# Patient Record
Sex: Female | Born: 1995 | Race: White | Hispanic: No | Marital: Married | Smoking: Never smoker
Health system: Southern US, Community
[De-identification: ages and names within clinical notes are randomized; demographics above are authoritative.]

## PROBLEM LIST (undated history)

## (undated) ENCOUNTER — Inpatient Hospital Stay (HOSPITAL_COMMUNITY): Payer: Self-pay

## (undated) DIAGNOSIS — F32A Depression, unspecified: Secondary | ICD-10-CM

## (undated) DIAGNOSIS — F909 Attention-deficit hyperactivity disorder, unspecified type: Secondary | ICD-10-CM

## (undated) DIAGNOSIS — O139 Gestational [pregnancy-induced] hypertension without significant proteinuria, unspecified trimester: Secondary | ICD-10-CM

## (undated) DIAGNOSIS — F329 Major depressive disorder, single episode, unspecified: Secondary | ICD-10-CM

## (undated) HISTORY — PX: NO PAST SURGERIES: SHX2092

## (undated) HISTORY — DX: Attention-deficit hyperactivity disorder, unspecified type: F90.9

## (undated) HISTORY — PX: BREAST SURGERY: SHX581

---
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
## 2016-06-04 ENCOUNTER — Encounter: Payer: Self-pay | Admitting: Family Medicine

## 2016-06-04 ENCOUNTER — Ambulatory Visit (INDEPENDENT_AMBULATORY_CARE_PROVIDER_SITE_OTHER): Payer: Managed Care, Other (non HMO) | Admitting: Family Medicine

## 2016-06-04 VITALS — BP 120/80 | HR 68 | Ht 65.5 in | Wt 151.4 lb

## 2016-06-04 DIAGNOSIS — F988 Other specified behavioral and emotional disorders with onset usually occurring in childhood and adolescence: Secondary | ICD-10-CM

## 2016-06-04 DIAGNOSIS — F909 Attention-deficit hyperactivity disorder, unspecified type: Secondary | ICD-10-CM | POA: Diagnosis not present

## 2016-06-04 MED ORDER — AMPHETAMINE-DEXTROAMPHET ER 20 MG PO CP24: 20.0000 mg | ORAL_CAPSULE | ORAL | 0 refills | Status: DC

## 2016-06-04 NOTE — Progress Notes (Signed)
   Subjective:    Patient ID: Stacy Myers, female    DOB: 10-03-1996, 20 y.o.   MRN: [id number]  HPI Chief Complaint  Patient presents with  . Advice Only    discuss going back on adderall   She is new to the practice and here to discuss ADHD.  She has been going to Dr. Merla Riches at [facility]. Has not had medical care other than for pregnancy in 4 years.  States she was diagnosed 6 years ago with ADD by Dr. Merla Riches. States she filled out a questionarire at his office and was found to meet criteria for ADD She states she is having a hard time focusing in a conversation and this is affecting her daily life. She remembers having issues since age 65.  Took adderall XR 20 mg for 2-3 years. States this worked well for her. Did not have any side effects. Has not taken it for 3 1/2 years due to pregnancy and then she never started back on it and tried to manage without it.   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 .   Denies history of depression, anxiety, addiction.  OB/GYN Dr. Rana Snare at [facility].   1 pregnancy. Age 40.  Has IUD. Is not breastfeeding.   Sore on inner lip that has been present for about a week. Dentist is Dr. Regan Rakers. She has not seen him for this issue.    Review of Systems Pertinent positives and negatives in the history of present illness.     Objective:   Physical Exam BP 120/80   Pulse 68   Ht 5' 5.5" (1.664 m)   Wt 151 lb 6.4 oz (68.7 kg)   BMI 24.81 kg/m   Alert and in no distress.Pharyngeal area is normal. Neck is supple without adenopathy or thyromegaly. Cardiac exam shows a regular sinus rhythm without murmurs or gallops. Lungs are clear to auscultation.      Assessment & Plan:  ADD (attention deficit disorder)  Reviewed medical record and Dr. Netta Corrigan note regarding patient ADD and medication history. Discussed that I will start her back on medication and see how she does. Prescription given  for Adderall XR 20 mg. She will call in one week and report back how the medication is helping, how long and if she has any side effects. She will follow up in 1 month before she needs a refill.  She is overdue for a physical and I recommend that she schedule this in the near future.

## 2016-06-04 NOTE — Patient Instructions (Signed)
I'm giving you prescription for Adderall today. Please call in one week and let me know-- is the medication working, how long is it working, and are you having any side effects??? Follow up in one month in order for me to refill the prescription and we can do your physical exam and labs at that time.

## 2016-06-10 ENCOUNTER — Telehealth: Payer: Self-pay | Admitting: Family Medicine

## 2016-06-10 NOTE — Telephone Encounter (Signed)
Rcvd office notes, pap from [facility]

## 2016-06-11 ENCOUNTER — Encounter: Payer: Self-pay | Admitting: Family Medicine

## 2016-06-11 DIAGNOSIS — R87610 Atypical squamous cells of undetermined significance on cytologic smear of cervix (ASC-US): Secondary | ICD-10-CM | POA: Insufficient documentation

## 2016-06-15 ENCOUNTER — Encounter: Payer: Self-pay | Admitting: Family Medicine

## 2016-07-03 ENCOUNTER — Ambulatory Visit (INDEPENDENT_AMBULATORY_CARE_PROVIDER_SITE_OTHER): Payer: Managed Care, Other (non HMO) | Admitting: Family Medicine

## 2016-07-03 ENCOUNTER — Encounter: Payer: Self-pay | Admitting: Family Medicine

## 2016-07-03 VITALS — BP 106/68 | HR 64 | Wt 148.2 lb

## 2016-07-03 DIAGNOSIS — Z862 Personal history of diseases of the blood and blood-forming organs and certain disorders involving the immune mechanism: Secondary | ICD-10-CM | POA: Diagnosis not present

## 2016-07-03 DIAGNOSIS — F988 Other specified behavioral and emotional disorders with onset usually occurring in childhood and adolescence: Secondary | ICD-10-CM

## 2016-07-03 DIAGNOSIS — F909 Attention-deficit hyperactivity disorder, unspecified type: Secondary | ICD-10-CM

## 2016-07-03 DIAGNOSIS — Z8639 Personal history of other endocrine, nutritional and metabolic disease: Secondary | ICD-10-CM | POA: Diagnosis not present

## 2016-07-03 LAB — COMPREHENSIVE METABOLIC PANEL
ALK PHOS: 55 U/L (ref 47–176)
ALT: 7 U/L (ref 5–32)
AST: 13 U/L (ref 12–32)
Albumin: 4.2 g/dL (ref 3.6–5.1)
BILIRUBIN TOTAL: 0.5 mg/dL (ref 0.2–1.1)
BUN: 9 mg/dL (ref 7–20)
CO2: 27 mmol/L (ref 20–31)
CREATININE: 0.61 mg/dL (ref 0.50–1.00)
Calcium: 9 mg/dL (ref 8.9–10.4)
Chloride: 103 mmol/L (ref 98–110)
Glucose, Bld: 78 mg/dL (ref 65–99)
Potassium: 4.7 mmol/L (ref 3.8–5.1)
SODIUM: 135 mmol/L (ref 135–146)
Total Protein: 6.6 g/dL (ref 6.3–8.2)

## 2016-07-03 LAB — CBC WITH DIFFERENTIAL/PLATELET
Basophils Absolute: 0 cells/uL (ref 0–200)
Basophils Relative: 0 %
EOS PCT: 2 %
Eosinophils Absolute: 90 cells/uL (ref 15–500)
HEMATOCRIT: 38.7 % (ref 35.0–45.0)
HEMOGLOBIN: 12.8 g/dL (ref 11.7–15.5)
Lymphocytes Relative: 37 %
Lymphs Abs: 1665 cells/uL (ref 850–3900)
MCH: 29.4 pg (ref 27.0–33.0)
MCHC: 33.1 g/dL (ref 32.0–36.0)
MCV: 89 fL (ref 80.0–100.0)
MONO ABS: 270 {cells}/uL (ref 200–950)
MPV: 9.7 fL (ref 7.5–12.5)
Monocytes Relative: 6 %
NEUTROS ABS: 2475 {cells}/uL (ref 1500–7800)
Neutrophils Relative %: 55 %
Platelets: 234 10*3/uL (ref 140–400)
RBC: 4.35 MIL/uL (ref 3.80–5.10)
RDW: 13.3 % (ref 11.0–15.0)
WBC: 4.5 10*3/uL (ref 4.0–10.5)

## 2016-07-03 MED ORDER — AMPHETAMINE-DEXTROAMPHET ER 20 MG PO CP24
20.0000 mg | ORAL_CAPSULE | ORAL | 0 refills | Status: DC
Start: 2016-09-02 — End: 2017-08-09

## 2016-07-03 MED ORDER — AMPHETAMINE-DEXTROAMPHET ER 20 MG PO CP24: 20.0000 mg | ORAL_CAPSULE | ORAL | 0 refills | Status: DC

## 2016-07-03 NOTE — Patient Instructions (Signed)
Return in late February or in March for a fasting CPE.  Return next Tuesday for TB test and Flu shot.  You will need to call and come by in 3 months for refills.

## 2016-07-03 NOTE — Progress Notes (Signed)
   Subjective:    Patient ID: Stacy Myers, female    DOB: Dec 30, 1995, 20 y.o.   MRN: [id number]  HPI Chief Complaint  Patient presents with  . Other    med check   She is here for follow up on ADD medication. She is also requesting blood work.  States she had a physical exam at her OB/GYN in 12/2015 but no labs. 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   Started her back on Adderrall XR for ADD and reports medication is working well and lasts 8 hours at least. She is able to focus and get her work done.  Eating fine. Not as much during the day.  Sleeping ok. No issues.  No side effects. No other concerns or questions.  Denies fever, chills, fatigue, chest pain, palpitations, DOE, abdominal pain, nausea, vomiting, diarrhea.   Reviewed allergies, medications, past medical,  and social history.   Review of Systems Pertinent positives and negatives in the history of present illness.     Objective:   Physical Exam BP 106/68   Pulse 64   Wt 148 lb 3.2 oz (67.2 kg)   Breastfeeding? No   BMI 24.29 kg/m   Alert and oriented and in no acute distress. Not otherwise examined.       Assessment & Plan:  ADD (attention deficit disorder) - Plan: CBC with Differential/Platelet, Comprehensive metabolic panel, amphetamine-dextroamphetamine (ADDERALL XR) 20 MG 24 hr capsule, amphetamine-dextroamphetamine (ADDERALL XR) 20 MG 24 hr capsule, amphetamine-dextroamphetamine (ADDERALL XR) 20 MG 24 hr capsule  History of anemia - Plan: CBC with Differential/Platelet  History of hypokalemia - Plan: Comprehensive metabolic panel  She appears to be doing well on adderall. No side effects. Continue on current dose.  Refills given to patient for 3 months of ADD medication with instructions to keep them safe and I will not be able to reprint them if they are lost. She has IUD for birth control and is not breastfeeding.  She has not had repeat labs since  history of anemia and low potassium during pregnancy. Documented physical at OB/GYN in 12/2015 but no labs received.  Follow up pending labs.   she will return next week for TB test and flu shot.  Return for a CPE fasting in 02-12/2016.

## 2016-07-07 ENCOUNTER — Other Ambulatory Visit: Payer: Managed Care, Other (non HMO)

## 2017-08-09 ENCOUNTER — Ambulatory Visit (INDEPENDENT_AMBULATORY_CARE_PROVIDER_SITE_OTHER): Payer: Managed Care, Other (non HMO) | Admitting: Family Medicine

## 2017-08-09 ENCOUNTER — Encounter: Payer: Self-pay | Admitting: Family Medicine

## 2017-08-09 ENCOUNTER — Encounter: Payer: Managed Care, Other (non HMO) | Admitting: Family Medicine

## 2017-08-09 VITALS — BP 122/78 | HR 76 | Ht 65.0 in | Wt 146.6 lb

## 2017-08-09 DIAGNOSIS — Z23 Encounter for immunization: Secondary | ICD-10-CM | POA: Diagnosis not present

## 2017-08-09 DIAGNOSIS — F988 Other specified behavioral and emotional disorders with onset usually occurring in childhood and adolescence: Secondary | ICD-10-CM | POA: Diagnosis not present

## 2017-08-09 DIAGNOSIS — F418 Other specified anxiety disorders: Secondary | ICD-10-CM | POA: Diagnosis not present

## 2017-08-09 MED ORDER — AMPHETAMINE-DEXTROAMPHETAMINE 10 MG PO TABS: 10.0000 mg | ORAL_TABLET | Freq: Two times a day (BID) | ORAL | 0 refills | Status: DC

## 2017-08-09 NOTE — Progress Notes (Signed)
   Subjective:    Patient ID: Stacy Myers, female    DOB: April 26, 1996, 21 y.o.   MRN: [id number]  HPI Chief Complaint  Patient presents with  . med check    med check, hads an extended release capsule but wants tablet incase she needs to break it in half when doing late night studying. will get flu shot today   She is here to follow up on ADD and Adderall XR.  States medication is not lasting into the evening when she needs to study but she is not able to go to sleep until 2 am so she knows the medication is still in her system.  States she takes it at 9 am because she has online classes several hours each day. She is also having to take care of her child who is 62 1/2 years old.   States she has always been on long acting medication for ADD, since 8th grade.   States she has been having increased stress and anxiety related to school and issues with her baby's father. States she has a tough time when things are out of her control. Feels like she is on the verge of having panic attacks. States she breaks out in a sweat, feels like she is breathing fast and and has chest tightness intermittently. This is happening 3-4 times per week and has been ongoing for about 6 months or so.  Has seen a counselor in the distant past.   States she wore a heart monitor in the past while in HS and everything checked out fine and was diagnosed with anxiety at that time.   Reviewed allergies, medications, past medical, surgical, family, and social history.   Review of Systems Pertinent positives and negatives in the history of present illness.     Objective:   Physical Exam BP 122/78   Pulse 76   Ht  (1.651 m)   Wt 146 lb 9.6 oz (66.5 kg)   BMI 24.40 kg/m  Alert and oriented and in no acute distress. Not otherwise examined.       Assessment & Plan:  Attention deficit disorder, unspecified hyperactivity presence - Plan: amphetamine-dextroamphetamine (ADDERALL) 10 MG tablet  Need for  influenza vaccination - Plan: Flu Vaccine QUAD 36+ mos IM  Situational anxiety  She will stop the long acting Adderall and start taking the short acting. Prescription given to patient. She will call me later this week and let me know how she is doing on the medication.  Recommend counseling and keeping a journal regarding her stress, anxiety and physiological symptoms. We will follow up in 4 weeks or sooner if symptoms get worse.  Flu shot given.

## 2017-08-09 NOTE — Patient Instructions (Signed)
Stop the Adderall XR capsules and start the shorter acting Adderall.   Call me Friday and let me know 3 things. 1) it it working 2) how long does it work 3) any side effects

## 2017-09-09 NOTE — Progress Notes (Signed)
Subjective:    Patient ID: Stacy Myers, female    DOB: [birth year], 21 y.o.   MRN: [id number]  HPI Chief Complaint  Patient presents with  . cpe    not fasting cpe, adderall is not working    She is here for a complete physical exam.  We switched her from Adderall XR to shorter acting Adderall 10 mg and she reports her appetite and sleep are both improved but she "cannot focus at all". States she does not feel like the medication is working. No side effects.   Continues to have issues with intermittent anxiety and stress related to school and raising a 20 year old. She has a strong support network with her parents. States she feels some days like she is not a good enough mom. She does not want to go out in public or meet new people. Denies feeling depressed or suicidal.  Has seen a counselor in the past but not recently. Never taken medication for anxiety or depression.  Stress with school work.   Previous medical care: Last CPE: January OB/GYN visit.   Other providers: OB/GYN- Dr. Rana SnareLowe   Social history: Lives with parents and 20 year old son, FT student pre-nursing  Denies smoking, drinking alcohol, drug use  Diet: fairly healthy  Excerise: occasionally- 2 times per week.   Immunizations: up to date.   Health maintenance:  Mammogram: N/A Colonoscopy: N/A Last Gynecological Exam: January at OB/GYN  Last Menstrual cycle: LMP: 09/02/2017  Pregnancies: 1 Last Dental Exam: once annually  Last Eye Exam: 6 months ago. Eyeglasses.   Wears seatbelt always, uses sunscreen, smoke detectors in home and functioning, does not text while driving and feels safe in home environment.   Reviewed allergies, medications, past medical, surgical, family, and social history.    Review of Systems Review of Systems Constitutional: -fever, -chills, -sweats, -unexpected weight change,-fatigue ENT: -runny nose, -ear pain, -sore throat Cardiology:  -chest pain, -palpitations,  -edema Respiratory: -cough, -shortness of breath, -wheezing Gastroenterology: -abdominal pain, -nausea, -vomiting, -diarrhea, -constipation  Hematology: -bleeding or bruising problems Musculoskeletal: -arthralgias, -myalgias, -joint swelling, -back pain Ophthalmology: -vision changes Urology: -dysuria, -difficulty urinating, -hematuria, -urinary frequency, -urgency Neurology: -headache, -weakness, -tingling, -numbness       Objective:   Physical Exam BP 120/70   Pulse 71   Ht 5' 5.5" (1.664 m)   Wt 147 lb 9.6 oz (67 kg)   LMP 09/02/2017   BMI 24.19 kg/m   General Appearance:    Alert, cooperative, no distress, appears stated age  Head:    Normocephalic, without obvious abnormality, atraumatic  Eyes:    PERRL, conjunctiva/corneas clear, EOM's intact, fundi    benign  Ears:    Normal TM's and external ear canals  Nose:   Nares normal, mucosa normal, no drainage or sinus   tenderness  Throat:   Lips, mucosa, and tongue normal; teeth and gums normal  Neck:   Supple, no lymphadenopathy;  thyroid:  no   enlargement/tenderness/nodules; no carotid   bruit or JVD  Back:    Spine nontender, no curvature, ROM normal, no CVA     tenderness  Lungs:     Clear to auscultation bilaterally without wheezes, rales or     ronchi; respirations unlabored  Chest Wall:    No tenderness or deformity   Heart:    Regular rate and rhythm, S1 and S2 normal, no murmur, rub   or gallop  Breast Exam:    Done at OB/GYN  Abdomen:     Soft, non-tender, nondistended, normoactive bowel sounds,    no masses, no hepatosplenomegaly  Genitalia:    Done at OB/GYN  Rectal:    Not performed due to age<40 and no related complaints  Extremities:   No clubbing, cyanosis or edema  Pulses:   2+ and symmetric all extremities  Skin:   Skin color, texture, turgor normal, no rashes or lesions  Lymph nodes:   Cervical, supraclavicular, and axillary nodes normal  Neurologic:   CNII-XII intact, normal strength, sensation and  gait; reflexes 2+ and symmetric throughout          Psych:   Normal mood, affect, hygiene and grooming.    Urinalysis dipstick: negative       Assessment & Plan:  Routine general medical examination at a health care facility - Plan: POCT Urinalysis DIP (Proadvantage Device)  Attention deficit disorder, unspecified hyperactivity presence  GAD (generalized anxiety disorder)  She appears healthy overall. She is busy with school and raising her 20 year old son. She and her son are living with her parents and this seems to be a good situation for her.  Up to date with immunizations and health maintenance.  Counseled on GAD and it does not appear that she is in any danger or a harm to herself or others. Anxiety is intermittent and not affecting her ability to function and take care of tasks. Discussed that a normal level of anxiety and stress is ok as long as she is able to function. She is doing well in school, grades are in A range. Recommend that she see a counselor to help cope with life stressors. She and I are in agreement that we will not start on medication for anxiety at this point. She does feel depressed.  ADD- recent medication switch from Adderral XR to Adderall 10 mg due to inability to sleep and poor appetite. Short acting Adderall does not seem to be effective. She is not having any side effects but not abel to focus and complete tasks. We will bump her up to 15 mg dose once daily for now and she will call in 1 week to let me know how she is doing. Follow up in 1 month.

## 2017-09-10 ENCOUNTER — Ambulatory Visit: Payer: Managed Care, Other (non HMO) | Admitting: Family Medicine

## 2017-09-10 ENCOUNTER — Encounter: Payer: Self-pay | Admitting: Family Medicine

## 2017-09-10 VITALS — BP 120/70 | HR 71 | Ht 65.5 in | Wt 147.6 lb

## 2017-09-10 DIAGNOSIS — Z0000 Encounter for general adult medical examination without abnormal findings: Secondary | ICD-10-CM | POA: Diagnosis not present

## 2017-09-10 DIAGNOSIS — F411 Generalized anxiety disorder: Secondary | ICD-10-CM

## 2017-09-10 DIAGNOSIS — F988 Other specified behavioral and emotional disorders with onset usually occurring in childhood and adolescence: Secondary | ICD-10-CM | POA: Diagnosis not present

## 2017-09-10 LAB — POCT URINALYSIS DIP (PROADVANTAGE DEVICE)
BILIRUBIN UA: NEGATIVE
BILIRUBIN UA: NEGATIVE mg/dL
Blood, UA: NEGATIVE
Glucose, UA: NEGATIVE mg/dL
Leukocytes, UA: NEGATIVE
Nitrite, UA: NEGATIVE
PH UA: 6 (ref 5.0–8.0)
Protein Ur, POC: NEGATIVE mg/dL
Specific Gravity, Urine: 1.02
Urobilinogen, Ur: NEGATIVE

## 2017-09-10 MED ORDER — AMPHETAMINE-DEXTROAMPHETAMINE 15 MG PO TABS: 15.0000 mg | ORAL_TABLET | Freq: Every day | ORAL | 0 refills | Status: DC

## 2017-09-10 NOTE — Patient Instructions (Addendum)
I am increasing your dose of Adderall. Try the 15 mg tablet and call me in one week to let me know 3 things: 1) is the medication working 2) how long does it work 3) any side effects or concerns    You can call to schedule your appointment with a counselor.  A few offices are listed below for you to call.   [facility]  [address], [place], [place] [address]  Phone: [phone]  [facility] [address] [place], [place] [address]  Phone: [phone]  [facility]  [address], [place], [place] [address] Phone: [phone]      Preventative Care for Adults - Female      MAINTAIN REGULAR HEALTH EXAMS:  A routine yearly physical is a good way to check in with your primary care provider about your health and preventive screening. It is also an opportunity to share updates about your health and any concerns you have, and receive a thorough all-over exam.   Most health insurance companies pay for at least some preventative services.  Check with your health plan for specific coverages.  WHAT PREVENTATIVE SERVICES DO WOMEN NEED?  Adult women should have their weight and blood pressure checked regularly.   Women age 11 and older should have their cholesterol levels checked regularly.  Women should be screened for cervical cancer with a Pap smear and pelvic exam beginning at age 24.   Breast cancer screening generally begins at age 75 with a mammogram and breast exam by your primary care provider.    Beginning at age 47 and continuing to age 38, women should be screened for colorectal cancer.  Certain people may need continued testing until age 79.  Updating vaccinations is part of preventative care.  Vaccinations help protect against diseases such as the flu.  Osteoporosis is a disease in which the bones lose minerals and strength as we age. Women ages 74 and over should  discuss this with their caregivers, as should women after menopause who have other risk factors.  Lab tests are generally done as part of preventative care to screen for anemia and blood disorders, to screen for problems with the kidneys and liver, to screen for bladder problems, to check blood sugar, and to check your cholesterol level.  Preventative services generally include counseling about diet, exercise, avoiding tobacco, drugs, excessive alcohol consumption, and sexually transmitted infections.    GENERAL RECOMMENDATIONS FOR GOOD HEALTH:  Healthy diet:  Eat a variety of foods, including fruit, vegetables, animal or vegetable protein, such as meat, fish, chicken, and eggs, or beans, lentils, tofu, and grains, such as rice.  Drink plenty of water daily.  Decrease saturated fat in the diet, avoid lots of red meat, processed foods, sweets, fast foods, and fried foods.  Exercise:  Aerobic exercise helps maintain good heart health. At least 30-40 minutes of moderate-intensity exercise is recommended. For example, a brisk walk that increases your heart rate and breathing. This should be done on most days of the week.   Find a type of exercise or a variety of exercises that you enjoy so that it becomes a part of your daily life.  Examples are running, walking, swimming, water aerobics, and biking.  For motivation and support, explore group exercise such as aerobic class, spin class, Zumba, Yoga,or  martial arts, etc.    Set exercise goals for yourself, such as a certain  weight goal, walk or run in a race such as a 5k walk/run.  Speak to your primary care provider about exercise goals.  Disease prevention:  If you smoke or chew tobacco, find out from your caregiver how to quit. It can literally save your life, no matter how long you have been a tobacco user. If you do not use tobacco, never begin.   Maintain a healthy diet and normal weight. Increased weight leads to problems with blood  pressure and diabetes.   The Body Mass Index or BMI is a way of measuring how much of your body is fat. Having a BMI above 27 increases the risk of heart disease, diabetes, hypertension, stroke and other problems related to obesity. Your caregiver can help determine your BMI and based on it develop an exercise and dietary program to help you achieve or maintain this important measurement at a healthful level.  High blood pressure causes heart and blood vessel problems.  Persistent high blood pressure should be treated with medicine if weight loss and exercise do not work.   Fat and cholesterol leaves deposits in your arteries that can block them. This causes heart disease and vessel disease elsewhere in your body.  If your cholesterol is found to be high, or if you have heart disease or certain other medical conditions, then you may need to have your cholesterol monitored frequently and be treated with medication.   Ask if you should have a cardiac stress test if your history suggests this. A stress test is a test done on a treadmill that looks for heart disease. This test can find disease prior to there being a problem.  Menopause can be associated with physical symptoms and risks. Hormone replacement therapy is available to decrease these. You should talk to your caregiver about whether starting or continuing to take hormones is right for you.   Osteoporosis is a disease in which the bones lose minerals and strength as we age. This can result in serious bone fractures. Risk of osteoporosis can be identified using a bone density scan. Women ages 3065 and over should discuss this with their caregivers, as should women after menopause who have other risk factors. Ask your caregiver whether you should be taking a calcium supplement and Vitamin D, to reduce the rate of osteoporosis.   Avoid drinking alcohol in excess (more than two drinks per day).  Avoid use of street drugs. Do not share needles with  anyone. Ask for professional help if you need assistance or instructions on stopping the use of alcohol, cigarettes, and/or drugs.  Brush your teeth twice a day with fluoride toothpaste, and floss once a day. Good oral hygiene prevents tooth decay and gum disease. The problems can be painful, unattractive, and can cause other health problems. Visit your dentist for a routine oral and dental check up and preventive care every 6-12 months.   Look at your skin regularly.  Use a mirror to look at your back. Notify your caregivers of changes in moles, especially if there are changes in shapes, colors, a size larger than a pencil eraser, an irregular border, or development of new moles.  Safety:  Use seatbelts 100% of the time, whether driving or as a passenger.  Use safety devices such as hearing protection if you work in environments with loud noise or significant background noise.  Use safety glasses when doing any work that could send debris in to the eyes.  Use a helmet if you ride a  bike or motorcycle.  Use appropriate safety gear for contact sports.  Talk to your caregiver about gun safety.  Use sunscreen with a SPF (or skin protection factor) of 15 or greater.  Lighter skinned people are at a greater risk of skin cancer. Don't forget to also wear sunglasses in order to protect your eyes from too much damaging sunlight. Damaging sunlight can accelerate cataract formation.   Practice safe sex. Use condoms. Condoms are used for birth control and to help reduce the spread of sexually transmitted infections (or STIs).  Some of the STIs are gonorrhea (the clap), chlamydia, syphilis, trichomonas, herpes, HPV (human papilloma virus) and HIV (human immunodeficiency virus) which causes AIDS. The herpes, HIV and HPV are viral illnesses that have no cure. These can result in disability, cancer and death.   Keep carbon monoxide and smoke detectors in your home functioning at all times. Change the batteries every  6 months or use a model that plugs into the wall.   Vaccinations:  Stay up to date with your tetanus shots and other required immunizations. You should have a booster for tetanus every 10 years. Be sure to get your flu shot every year, since 5%-20% of the [place] population comes down with the flu. The flu vaccine changes each year, so being vaccinated once is not enough. Get your shot in the fall, before the flu season peaks.   Other vaccines to consider:  Human Papilloma Virus or HPV causes cancer of the cervix, and other infections that can be transmitted from person to person. There is a vaccine for HPV, and females should get immunized between the ages of 2 and 43. It requires a series of 3 shots.   Pneumococcal vaccine to protect against certain types of pneumonia.  This is normally recommended for adults age 13 or older.  However, adults younger than 20 years old with certain underlying conditions such as diabetes, heart or lung disease should also receive the vaccine.  Shingles vaccine to protect against Varicella Zoster if you are older than age 73, or younger than 20 years old with certain underlying illness.  Hepatitis A vaccine to protect against a form of infection of the liver by a virus acquired from food.  Hepatitis B vaccine to protect against a form of infection of the liver by a virus acquired from blood or body fluids, particularly if you work in health care.  If you plan to travel internationally, check with your local health department for specific vaccination recommendations.  Cancer Screening:  Breast cancer screening is essential to preventive care for women. All women age 88 and older should perform a breast self-exam every month. At age 71 and older, women should have their caregiver complete a breast exam each year. Women at ages 31 and older should have a mammogram (x-ray film) of the breasts. Your caregiver can discuss how often you need mammograms.    Cervical  cancer screening includes taking a Pap smear (sample of cells examined under a microscope) from the cervix (end of the uterus). It also includes testing for HPV (Human Papilloma Virus, which can cause cervical cancer). Screening and a pelvic exam should begin at age 32, or 3 years after a woman becomes sexually active. Screening should occur every year, with a Pap smear but no HPV testing, up to age 58. After age 27, you should have a Pap smear every 3 years with HPV testing, if no HPV was found previously.   Most routine colon cancer  screening begins at the age of 20. On a yearly basis, doctors may provide special easy to use take-home tests to check for hidden blood in the stool. Sigmoidoscopy or colonoscopy can detect the earliest forms of colon cancer and is life saving. These tests use a small camera at the end of a tube to directly examine the colon. Speak to your caregiver about this at age 20, when routine screening begins (and is repeated every 5 years unless early forms of pre-cancerous polyps or small growths are found).

## 2017-10-08 ENCOUNTER — Ambulatory Visit: Payer: Managed Care, Other (non HMO) | Admitting: Family Medicine

## 2017-10-13 ENCOUNTER — Encounter: Payer: Self-pay | Admitting: Family Medicine

## 2018-02-28 ENCOUNTER — Telehealth: Payer: Self-pay | Admitting: Internal Medicine

## 2018-02-28 ENCOUNTER — Encounter: Payer: Managed Care, Other (non HMO) | Admitting: Family Medicine

## 2018-02-28 NOTE — Telephone Encounter (Signed)

## 2018-02-28 NOTE — Telephone Encounter (Signed)
appt has already been rescheduled.

## 2018-02-28 NOTE — Telephone Encounter (Signed)
Follow up due to medication

## 2018-03-02 ENCOUNTER — Encounter: Payer: Managed Care, Other (non HMO) | Admitting: Family Medicine

## 2018-03-14 ENCOUNTER — Encounter: Payer: Self-pay | Admitting: Family Medicine

## 2018-03-31 ENCOUNTER — Telehealth: Payer: Self-pay | Admitting: Family Medicine

## 2018-03-31 NOTE — Telephone Encounter (Signed)
Please take care of this.  

## 2018-03-31 NOTE — Telephone Encounter (Signed)
Done

## 2018-03-31 NOTE — Telephone Encounter (Signed)
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

## 2018-05-31 ENCOUNTER — Other Ambulatory Visit: Payer: Self-pay

## 2018-05-31 ENCOUNTER — Ambulatory Visit: Payer: Managed Care, Other (non HMO) | Admitting: Family Medicine

## 2018-05-31 ENCOUNTER — Encounter: Payer: Self-pay | Admitting: Family Medicine

## 2018-05-31 VITALS — BP 120/80 | HR 94 | Temp 98.1°F | Ht 65.0 in | Wt 150.8 lb

## 2018-05-31 DIAGNOSIS — Z5181 Encounter for therapeutic drug level monitoring: Secondary | ICD-10-CM

## 2018-05-31 DIAGNOSIS — F988 Other specified behavioral and emotional disorders with onset usually occurring in childhood and adolescence: Secondary | ICD-10-CM

## 2018-05-31 MED ORDER — AMPHETAMINE-DEXTROAMPHET ER 15 MG PO CP24: 15.0000 mg | ORAL_CAPSULE | ORAL | 0 refills | Status: DC

## 2018-05-31 NOTE — Progress Notes (Signed)
7/30/20193:10 PM  Stacy Myers 11-03-1995, 22 y.o. female [id number]  Chief Complaint  Patient presents with  . Establish Care    looking to start bk on the adderall medication, diagnosed by dr. Yong Channel in 2017    HPI:   Patient is a 22 y.o. female with past medical history significant for ADD who presents today requesting restart of medication  Diagnosed by Dr Yong Channel when she was in first grade, on adderral mostly for high school and did well in school with it Has been intermittently on medication since then as she became pregnant and took a break from school Also she has not had a consistent PCP of recent Last PCP referred her to [facility] which she cant afford and therefore she came back to practice that originally diagnosed and treated her. She has enrolled in college and would like to restart medication before fall semester starts  Her last prescription for adderal was in jan 2019. Last dose was IR 15mg . States it did not last long enough for evening studying She is on OCP - by Gyn Denies any current issues with depression, anxiety or substance use  Edroy csr reviewed, last rx jan 2019  Fall Risk  09/10/2017  Falls in the past year? No     Depression screen PHQ 2/9 09/10/2017  Decreased Interest 0  Down, Depressed, Hopeless 0  PHQ - 2 Score 0    No Known Allergies  Prior to Admission medications   Medication Sig Start Date End Date Taking? Authorizing Provider  amphetamine-dextroamphetamine (ADDERALL) 15 MG tablet Take 1 tablet daily by mouth. 09/10/17  Yes Henson, Vickie L, NP-C  desogestrel-ethinyl estradiol (ISIBLOOM) 0.15-30 MG-MCG tablet Take 1 tablet by mouth daily.   Yes [provider]    Past Medical History:  Diagnosis Date  . ADHD (attention deficit hyperactivity disorder)   . Depression     Past Surgical History:  Procedure Laterality Date  . NO PAST SURGERIES      Social History   Tobacco Use  . Smoking  status: Never Smoker  . Smokeless tobacco: Never Used  Substance Use Topics  . Alcohol use: No    Comment: 24 oz    History reviewed. No pertinent family history.  Review of Systems  Constitutional: Negative for chills, fever, malaise/fatigue and weight loss.  Respiratory: Negative for cough and shortness of breath.   Cardiovascular: Negative for chest pain, palpitations and leg swelling.  Gastrointestinal: Negative for abdominal pain, nausea and vomiting.  Psychiatric/Behavioral: Negative for depression and substance abuse. The patient is not nervous/anxious and does not have insomnia.   All other systems reviewed and are negative.    OBJECTIVE:  Blood pressure 120/80, pulse 94, temperature 98.1 F (36.7 C), temperature source Oral, height 5\' 5"  (1.651 m), weight 150 lb 12.8 oz (68.4 kg), last menstrual period 05/27/2018, SpO2 99 %. Body mass index is 25.09 kg/m.   Wt Readings from Last 3 Encounters:  05/31/18 150 lb 12.8 oz (68.4 kg)  09/10/17 147 lb 9.6 oz (67 kg)  08/09/17 146 lb 9.6 oz (66.5 kg)    Physical Exam  Constitutional: She is oriented to person, place, and time. She appears well-developed and well-nourished.  HENT:  Head: Normocephalic and atraumatic.  Right Ear: Hearing, tympanic membrane, external ear and ear canal normal.  Left Ear: Hearing, tympanic membrane, external ear and ear canal normal.  Mouth/Throat: Oropharynx is clear and moist.  Eyes: Pupils are equal, round, and reactive to light. Conjunctivae  and EOM are normal.  Neck: Neck supple. No thyromegaly present.  Cardiovascular: Normal rate, regular rhythm, normal heart sounds and intact distal pulses. Exam reveals no gallop and no friction rub.  No murmur heard. Pulmonary/Chest: Effort normal and breath sounds normal. She has no wheezes. She has no rhonchi. She has no rales. Right breast exhibits no inverted nipple, no mass, no nipple discharge, no skin change and no tenderness. Left breast  exhibits no inverted nipple, no mass, no nipple discharge, no skin change and no tenderness. Breasts are symmetrical.  Abdominal: Soft. Bowel sounds are normal. She exhibits no distension. There is no hepatosplenomegaly. There is no tenderness. There is no guarding.  Genitourinary: There is no rash or lesion on the right labia. There is no rash or lesion on the left labia. Uterus is not enlarged, not fixed and not tender. Cervix exhibits no motion tenderness, no discharge and no friability. Right adnexum displays no mass and no tenderness. Left adnexum displays no mass and no tenderness. No erythema in the vagina. No vaginal discharge found.  Musculoskeletal: Normal range of motion. She exhibits no edema.  Lymphadenopathy:    She has no cervical adenopathy.    She has no axillary adenopathy.       Right: No supraclavicular adenopathy present.       Left: No supraclavicular adenopathy present.  Neurological: She is alert and oriented to person, place, and time. She has normal reflexes.  Skin: Skin is warm and dry.  Psychiatric: She has a normal mood and affect.  Nursing note and vitals reviewed.    ASSESSMENT and PLAN  1. Attention deficit disorder, unspecified hyperactivity presence 2. Encounter for therapeutic drug monitoring - ToxASSURE Select 13 (MW), Urine pmp reviewed. Diagnosed as a child, has done well with medication in the past. No concerns for mood disorders at this time. Starting back school. meds restarted. 3 month supply given. Med r/s/be reviewed. Follow-up in 3 months or sooner is needed.   Other orders - amphetamine-dextroamphetamine (ADDERALL XR) 15 MG 24 hr capsule; Take 1 capsule by mouth every morning. - amphetamine-dextroamphetamine (ADDERALL XR) 15 MG 24 hr capsule; Take 1 capsule by mouth every morning. - amphetamine-dextroamphetamine (ADDERALL XR) 15 MG 24 hr capsule; Take 1 capsule by mouth every morning.  Return in about 3 months (around 08/31/2018).    Myles LippsIrma  M Santiago, MD [facility] [address] [place], [place] [address] Ph.  [phone] Fax [phone]

## 2018-06-05 LAB — TOXASSURE SELECT 13 (MW), URINE

## 2018-06-12 ENCOUNTER — Encounter: Payer: Self-pay | Admitting: Family Medicine

## 2018-06-14 ENCOUNTER — Telehealth: Payer: Self-pay | Admitting: General Practice

## 2018-06-14 NOTE — Telephone Encounter (Signed)
Called pt to reschedule appt on 09/02/18. Left VM

## 2018-08-04 ENCOUNTER — Ambulatory Visit: Payer: Self-pay | Admitting: Gastroenterology

## 2018-09-02 ENCOUNTER — Ambulatory Visit: Payer: Managed Care, Other (non HMO) | Admitting: Family Medicine

## 2018-09-02 ENCOUNTER — Encounter: Payer: Self-pay | Admitting: Family Medicine

## 2018-09-02 ENCOUNTER — Other Ambulatory Visit: Payer: Self-pay

## 2018-09-02 VITALS — BP 121/85 | HR 77 | Temp 99.8°F | Ht 65.0 in | Wt 149.0 lb

## 2018-09-02 DIAGNOSIS — F988 Other specified behavioral and emotional disorders with onset usually occurring in childhood and adolescence: Secondary | ICD-10-CM

## 2018-09-02 DIAGNOSIS — Z5181 Encounter for therapeutic drug level monitoring: Secondary | ICD-10-CM

## 2018-09-02 MED ORDER — AMPHETAMINE-DEXTROAMPHET ER 25 MG PO CP24: 25.0000 mg | ORAL_CAPSULE | ORAL | 0 refills | Status: DC

## 2018-09-02 MED ORDER — AMPHETAMINE-DEXTROAMPHETAMINE 10 MG PO TABS: 10.0000 mg | ORAL_TABLET | Freq: Every day | ORAL | 0 refills | Status: DC | PRN

## 2018-09-02 NOTE — Patient Instructions (Addendum)
Please call/message me before next refill is due to let me know if we will continue with current regime or need to adjust.  if however we need to try a different medication then please make an appointment,    If you have lab work done today you will be contacted with your lab results within the next 2 weeks.  If you have not heard from [place] then please contact us. The fastest way to get your results is to register for My Chart.   IF you received an x-ray today, you will receive an invoice from [facility]. Please contact [facility] at [phone] with questions or concerns regarding your invoice.   IF you received labwork today, you will receive an invoice from [facility]. Please contact [facility] at [phone] with questions or concerns regarding your invoice.   Our billing staff will not be able to assist you with questions regarding bills from these companies.  You will be contacted with the lab results as soon as they are available. The fastest way to get your results is to activate your My Chart account. Instructions are located on the last page of this paperwork. If you have not heard from [place] regarding the results in 2 weeks, please contact this office.

## 2018-09-02 NOTE — Progress Notes (Signed)
11/1/20193:14 PM  October Peery Jun 08, 1996, 22 y.o. female [id number]  Chief Complaint  Patient presents with  . ADHD    medication refill, needing 3 refills     HPI:   Patient is a 22 y.o. female with past medical history significant for ADHD who presents today for routine followup  Restarted adderrall ER 15mg  at last visit pmp reviewed Feels it is not working at all currently Has some benefits for the first month and then has been diminishing Unable to focus, complete tasks, organize Doing mostly clinicals and PBLs Requesting IR as she heads to testing Having no side effects   Fall Risk  09/02/2018 09/10/2017  Falls in the past year? 0 No     Depression screen PHQ 2/9 09/10/2017  Decreased Interest 0  Down, Depressed, Hopeless 0  PHQ - 2 Score 0    No Known Allergies  Prior to Admission medications   Medication Sig Start Date End Date Taking? Authorizing Provider  amphetamine-dextroamphetamine (ADDERALL XR) 15 MG 24 hr capsule Take 1 capsule by mouth every morning. 05/31/18  Yes Myles Lipps, MD  amphetamine-dextroamphetamine (ADDERALL XR) 15 MG 24 hr capsule Take 1 capsule by mouth every morning. 06/30/18  Yes Myles Lipps, MD  amphetamine-dextroamphetamine (ADDERALL XR) 15 MG 24 hr capsule Take 1 capsule by mouth every morning. 07/30/18  Yes Myles Lipps, MD  CLARAVIS 40 MG capsule  08/03/18  Yes [provider]  desogestrel-ethinyl estradiol (ISIBLOOM) 0.15-30 MG-MCG tablet Take 1 tablet by mouth daily.   Yes [provider]  norethindrone-ethinyl estradiol-iron (MICROGESTIN FE,GILDESS FE,LOESTRIN FE) 1.5-30 MG-MCG tablet Take 1 tablet by mouth daily. For 84 consecutive tablets then take 7 days off, then continue 84 on and 7 off to control dysmenorrhea 02/12/13 03/16/14  Tonye Pearson, MD    Past Medical History:  Diagnosis Date  . ADHD (attention deficit hyperactivity disorder)   . Depression     Past Surgical History:    Procedure Laterality Date  . NO PAST SURGERIES      Social History   Tobacco Use  . Smoking status: Never Smoker  . Smokeless tobacco: Never Used  Substance Use Topics  . Alcohol use: No    Comment: 24 oz    History reviewed. No pertinent family history.  Review of Systems  Constitutional: Negative for weight loss.  Respiratory: Negative for shortness of breath.   Cardiovascular: Negative for palpitations.  Psychiatric/Behavioral: Negative for depression. The patient is not nervous/anxious and does not have insomnia.      OBJECTIVE:  Blood pressure 121/85, pulse 77, temperature 99.8 F (37.7 C), temperature source Oral, height 5\' 5"  (1.651 m), weight 149 lb (67.6 kg), last menstrual period 08/15/2018, SpO2 96 %. Body mass index is 24.79 kg/m.   Wt Readings from Last 3 Encounters:  09/02/18 149 lb (67.6 kg)  05/31/18 150 lb 12.8 oz (68.4 kg)  09/10/17 147 lb 9.6 oz (67 kg)    Physical Exam  Constitutional: She is oriented to person, place, and time. She appears well-developed and well-nourished.  HENT:  Head: Normocephalic and atraumatic.  Mouth/Throat: Mucous membranes are normal.  Eyes: Pupils are equal, round, and reactive to light. Conjunctivae and EOM are normal. No scleral icterus.  Neck: Neck supple.  Pulmonary/Chest: Effort normal.  Neurological: She is alert and oriented to person, place, and time.  Skin: Skin is warm and dry.  Psychiatric: She has a normal mood and affect.  Nursing note and vitals reviewed.  ASSESSMENT and PLAN  1. Attention deficit disorder, unspecified hyperactivity presence Not controlled. Increasing adderrall XR to 25mg , adding IR for evening studying. Patient to communicate with me before next refills. If not improvement consider changing stimulant.   2. Encounter for therapeutic drug monitoring - ToxASSURE Select 13 (MW), Urine  Other orders - amphetamine-dextroamphetamine (ADDERALL XR) 25 MG 24 hr capsule; Take 1 capsule  by mouth every morning. - amphetamine-dextroamphetamine (ADDERALL) 10 MG tablet; Take 1 tablet (10 mg total) by mouth daily as needed.   Return in about 3 months (around 12/03/2018) for ADHD.    Myles Lipps, MD Primary Care at [facility] [address] [place], [place] [address] Ph.  [phone] Fax [phone]

## 2018-09-08 LAB — TOXASSURE SELECT 13 (MW), URINE

## 2018-09-09 ENCOUNTER — Encounter: Payer: Self-pay | Admitting: Family Medicine

## 2018-09-16 ENCOUNTER — Telehealth: Payer: Self-pay | Admitting: Family Medicine

## 2018-09-16 NOTE — Telephone Encounter (Signed)
Copied from CRM [id number]. Topic: General - Other >> Sep 16, 2018 12:44 PM Lynne LoganHudson, Caryn D wrote: Reason for CRM: Pt called and stated she is being treated for ADHD by Dr. Leretha PolSantiago. 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 She Dr. Leretha PolSantiago to send documentation/letter to [email or web address] Attn: Cyndi Lennertaroline Womack    Please advise [phone]

## 2018-09-19 NOTE — Telephone Encounter (Signed)
Please write above letter thanks

## 2018-09-19 NOTE — Telephone Encounter (Signed)
pls see note 

## 2018-09-22 ENCOUNTER — Encounter: Payer: Self-pay | Admitting: *Deleted

## 2018-09-22 NOTE — Telephone Encounter (Signed)
Per pt she was able to get diagnosis information from my chart.

## 2018-09-28 ENCOUNTER — Telehealth: Payer: Self-pay | Admitting: Family Medicine

## 2018-09-28 NOTE — Telephone Encounter (Signed)
Called and spoke with pt regarding their appt on 12/08/18. Due to provider schedule changes, we needed to get pt rescheduled. Pt stated that she was driving and needed to look at her schedule so she can find a time that works. Pt states that she will call back and reschedule. When pt calls back, please schedule with Dr. Leretha PolSantiago at pts convenience. Thank you!

## 2018-10-11 ENCOUNTER — Other Ambulatory Visit: Payer: Self-pay | Admitting: Family Medicine

## 2018-10-14 MED ORDER — AMPHETAMINE-DEXTROAMPHET ER 25 MG PO CP24: 25.0000 mg | ORAL_CAPSULE | ORAL | 0 refills | Status: DC

## 2018-10-14 MED ORDER — AMPHETAMINE-DEXTROAMPHETAMINE 10 MG PO TABS: 10.0000 mg | ORAL_TABLET | Freq: Every day | ORAL | 0 refills | Status: DC | PRN

## 2018-12-08 ENCOUNTER — Ambulatory Visit: Payer: Managed Care, Other (non HMO) | Admitting: Family Medicine

## 2018-12-14 ENCOUNTER — Ambulatory Visit: Payer: Managed Care, Other (non HMO) | Admitting: Family Medicine

## 2018-12-14 ENCOUNTER — Encounter: Payer: Self-pay | Admitting: Family Medicine

## 2018-12-14 ENCOUNTER — Other Ambulatory Visit: Payer: Self-pay

## 2018-12-14 VITALS — BP 107/75 | HR 64 | Temp 98.4°F | Ht 65.0 in | Wt 142.8 lb

## 2018-12-14 DIAGNOSIS — F988 Other specified behavioral and emotional disorders with onset usually occurring in childhood and adolescence: Secondary | ICD-10-CM

## 2018-12-14 MED ORDER — AMPHETAMINE-DEXTROAMPHETAMINE 10 MG PO TABS: 10.0000 mg | ORAL_TABLET | Freq: Every day | ORAL | 0 refills | Status: DC

## 2018-12-14 MED ORDER — AMPHETAMINE-DEXTROAMPHET ER 25 MG PO CP24: 25.0000 mg | ORAL_CAPSULE | ORAL | 0 refills | Status: DC

## 2018-12-14 NOTE — Patient Instructions (Signed)
° ° ° °  If you have lab work done today you will be contacted with your lab results within the next 2 weeks.  If you have not heard from us then please contact us. The fastest way to get your results is to register for My Chart. ° ° °IF you received an x-ray today, you will receive an invoice from [facility]. Please contact [facility] at [phone] with questions or concerns regarding your invoice.  ° °IF you received labwork today, you will receive an invoice from [facility]. Please contact [facility] at [phone] with questions or concerns regarding your invoice.  ° °Our billing staff will not be able to assist you with questions regarding bills from these companies. ° °You will be contacted with the lab results as soon as they are available. The fastest way to get your results is to activate your My Chart account. Instructions are located on the last page of this paperwork. If you have not heard from us regarding the results in 2 weeks, please contact this office. °  ° ° ° °

## 2018-12-14 NOTE — Progress Notes (Signed)
2/12/20209:44 AM  Stacy Myers [birth year], 23 y.o. female [id number]  Chief Complaint  Patient presents with  . ADHD    refills on medication    HPI:   Patient is a 23 y.o. 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 0 0 No  Number falls in past yr: 0 - -  Injury with Fall? 0 - -     Depression screen [facility] 2/9 12/14/2018 09/10/2017  Decreased Interest 0 0  Down, Depressed, Hopeless 0 0  PHQ - 2 Score 0 0    No Known Allergies  Prior to Admission medications   Medication Sig Start Date End Date Taking? Authorizing Provider  amphetamine-dextroamphetamine (ADDERALL XR) 25 MG 24 hr capsule Take 1 capsule by mouth every morning. 10/14/18  Yes Myles Lipps, MD  CLARAVIS 40 MG capsule  08/03/18  Yes [provider]  desogestrel-ethinyl estradiol (ISIBLOOM) 0.15-30 MG-MCG tablet Take 1 tablet by mouth daily.   Yes [provider]  amphetamine-dextroamphetamine (ADDERALL) 10 MG tablet Take 1 tablet (10 mg total) by mouth daily as needed. 10/14/18 11/13/18  Myles Lipps, MD  norethindrone-ethinyl estradiol-iron  (MICROGESTIN FE,GILDESS FE,LOESTRIN FE) 1.5-30 MG-MCG tablet Take 1 tablet by mouth daily. For 84 consecutive tablets then take 7 days off, then continue 84 on and 7 off to control dysmenorrhea 02/12/13 03/16/14  Tonye Pearson, MD    Past Medical History:  Diagnosis Date  . ADHD (attention deficit hyperactivity disorder)   . Depression     Past Surgical History:  Procedure Laterality Date  . NO PAST SURGERIES      Social History   Tobacco Use  . Smoking status: Never Smoker  . Smokeless tobacco: Never Used  Substance Use Topics  . Alcohol use: No    Comment: 24 oz    History reviewed. No pertinent family history.  ROS Per hpi  OBJECTIVE:  Blood pressure 107/75, pulse 64, temperature 98.4 F (36.9 C), temperature source Oral, height 5\' 5"  (1.651 m), weight 142 lb 12.8 oz (64.8 kg), last menstrual period 12/02/2018, SpO2 98 %. Body mass index is 23.76 kg/m.   Physical Exam Vitals signs and nursing note reviewed.  Constitutional:      Appearance: She is well-developed.  HENT:     Head: Normocephalic and atraumatic.  Eyes:     General: No scleral icterus.    Conjunctiva/sclera: Conjunctivae normal.     Pupils: Pupils are equal, round, and reactive to light.  Neck:     Musculoskeletal: Neck supple.  Pulmonary:     Effort: Pulmonary effort is normal.  Skin:  General: Skin is warm and dry.  Neurological:     Mental Status: She is alert and oriented to person, place, and time.      ASSESSMENT and PLAN  1. Attention deficit disorder, unspecified hyperactivity presence Controlled. Cont current regime. Weight loss discussed, bmi normal. Encouraged healthy diet, no skipping meals, continue to monitor.  Other orders - amphetamine-dextroamphetamine (ADDERALL XR) 25 MG 24 hr capsule; Take 1 capsule by mouth every morning. - amphetamine-dextroamphetamine (ADDERALL XR) 25 MG 24 hr capsule; Take 1 capsule by mouth every morning. Ok to fill 60 days after signature -  amphetamine-dextroamphetamine (ADDERALL XR) 25 MG 24 hr capsule; Take 1 capsule by mouth every morning. Ok to fill 30 days after signature - amphetamine-dextroamphetamine (ADDERALL) 10 MG tablet; Take 1 tablet (10 mg total) by mouth daily with breakfast. Ok to fill 30 days after signature - amphetamine-dextroamphetamine (ADDERALL) 10 MG tablet; Take 1 tablet (10 mg total) by mouth daily with breakfast. - amphetamine-dextroamphetamine (ADDERALL) 10 MG tablet; Take 1 tablet (10 mg total) by mouth daily for 30 days. Ok to fill 60 days after signature  Return in about 3 months (around 03/14/2019). for ADD    Myles Lipps, MD Primary Care at [facility] [address] [place], [place] [address] Ph.  [phone] Fax [phone]

## 2018-12-29 ENCOUNTER — Encounter: Payer: Self-pay | Admitting: Family Medicine

## 2019-02-19 ENCOUNTER — Other Ambulatory Visit: Payer: Self-pay | Admitting: Family Medicine

## 2019-02-20 MED ORDER — AMPHETAMINE-DEXTROAMPHETAMINE 10 MG PO TABS: 10.0000 mg | ORAL_TABLET | Freq: Every day | ORAL | 0 refills | Status: DC

## 2019-02-20 MED ORDER — AMPHETAMINE-DEXTROAMPHET ER 25 MG PO CP24: 25.0000 mg | ORAL_CAPSULE | ORAL | 0 refills | Status: DC

## 2019-02-20 NOTE — Telephone Encounter (Signed)
pmp reviewd, appropriate meds refilled 

## 2019-02-27 ENCOUNTER — Telehealth: Payer: Self-pay | Admitting: Family Medicine

## 2019-02-27 NOTE — Telephone Encounter (Signed)
Copied from CRM [id number]. Topic: General - Inquiry >> Feb 27, 2019 11:11 AM Lynne Logan D wrote: Reason for CRM: Pt has appointment scheduled for 03/15/19. She would like to request a TB test as well. Advised to send CRM for Dr. Adela Glimpse approval. Please advise.

## 2019-02-28 NOTE — Telephone Encounter (Signed)
Note has been made on appointment note to have TB testing done.

## 2019-03-15 ENCOUNTER — Other Ambulatory Visit: Payer: Self-pay

## 2019-03-15 ENCOUNTER — Encounter: Payer: Self-pay | Admitting: Family Medicine

## 2019-03-15 ENCOUNTER — Ambulatory Visit: Payer: Managed Care, Other (non HMO) | Admitting: Family Medicine

## 2019-03-15 VITALS — BP 135/75 | HR 68 | Temp 98.8°F | Resp 18 | Ht 65.0 in | Wt 146.4 lb

## 2019-03-15 DIAGNOSIS — F988 Other specified behavioral and emotional disorders with onset usually occurring in childhood and adolescence: Secondary | ICD-10-CM | POA: Diagnosis not present

## 2019-03-15 DIAGNOSIS — Z5181 Encounter for therapeutic drug level monitoring: Secondary | ICD-10-CM | POA: Diagnosis not present

## 2019-03-15 DIAGNOSIS — Z111 Encounter for screening for respiratory tuberculosis: Secondary | ICD-10-CM | POA: Diagnosis not present

## 2019-03-15 MED ORDER — AMPHETAMINE-DEXTROAMPHETAMINE 20 MG PO TABS: 20.0000 mg | ORAL_TABLET | Freq: Two times a day (BID) | ORAL | 0 refills | Status: DC

## 2019-03-15 MED ORDER — AMPHETAMINE-DEXTROAMPHET ER 25 MG PO CP24: 25.0000 mg | ORAL_CAPSULE | Freq: Every day | ORAL | 0 refills | Status: DC | PRN

## 2019-03-15 NOTE — Patient Instructions (Signed)
° ° ° °  If you have lab work done today you will be contacted with your lab results within the next 2 weeks.  If you have not heard from us then please contact us. The fastest way to get your results is to register for My Chart. ° ° °IF you received an x-ray today, you will receive an invoice from [facility]. Please contact [facility] at [phone] with questions or concerns regarding your invoice.  ° °IF you received labwork today, you will receive an invoice from [facility]. Please contact [facility] at [phone] with questions or concerns regarding your invoice.  ° °Our billing staff will not be able to assist you with questions regarding bills from these companies. ° °You will be contacted with the lab results as soon as they are available. The fastest way to get your results is to activate your My Chart account. Instructions are located on the last page of this paperwork. If you have not heard from us regarding the results in 2 weeks, please contact this office. °  ° ° ° °

## 2019-03-15 NOTE — Progress Notes (Signed)
  Tuberculosis Risk Questionnaire  1. No Were you born outside the [place] in one of the following parts of the world: [place], [place], [place], [place] or [place]?    2. No Have you traveled outside the [place] and lived for more than one month in one of the following parts of the world: [place], [place], [place], [place] or [place]?    3. No Do you have a compromised immune system such as from any of the following conditions:HIV/AIDS, organ or bone marrow transplantation, diabetes, immunosuppressive medicines (e.g. Prednisone, Remicaide), leukemia, lymphoma, cancer of the head or neck, gastrectomy or jejunal bypass, end-stage renal disease (on dialysis), or silicosis?     4. Yes health care facility Have you ever or do you plan on working in: a residential care center, a health care facility, a jail or prison or homeless shelter?    5. No Have you ever: injected illegal drugs, used crack cocaine, lived in a homeless shelter  or been in jail or prison?     6. No Have you ever been exposed to anyone with infectious tuberculosis?  7. No Have you ever had a BCG vaccine? (BCG is a vaccine for tuberculosis  (TB) used in OTHER countries, NOT in the [place]).  8. No Have you ever been advised by a health care provider NOT to have a TB skin test?  9. No Have you ever had a POSITIVE TB skin test?  IF SO, when? n/a  IF SO, were you treated with INH? n/a  IF SO, where? n/a  Tuberculosis Symptom Questionnaire  Do you currently have any of the following symptoms?  1. No Unexplained cough lasting more than 3 weeks?   2. No Unexplained fever lasting more than 3 weeks.   3. No Night Sweats (sweating that leaves the bedclothes and sheets wet)     4. No Shortness of Breath   5. No Chest Pain   6. No Unintentional weight loss    7. No Unexplained fatigue (very tired for no reason)

## 2019-03-15 NOTE — Progress Notes (Signed)
5/13/20209:53 AM  Stacy Myers 1996/08/02, 23 y.o., female [id number]  Chief Complaint  Patient presents with  . Medication Refill    adderall  . PPD Reading    needs TB test    HPI:   Patient is a 23 y.o. 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, TB screen noted  Rx ER 25mg  in AM and IR 10mg  at pm Currently noticing that IR is working better, able to focus more in class and when studying, however needs to take higher dose 20mg , lasts about 5 hours She then relaxes for a while and will retake IR later in the day when she resumes studying She takes ER on T/Sat/Sun - has long day of clinicals or studying all day long Takes IR before any exams Denies any side effects on current regime  Wt Readings from Last 3 Encounters:  03/15/19 146 lb 6.4 oz (66.4 kg)  12/14/18 142 lb 12.8 oz (64.8 kg)  09/02/18 149 lb (67.6 kg)   BP Readings from Last 3 Encounters:  03/15/19 135/75  12/14/18 107/75  09/02/18 121/85    Fall Risk  03/15/2019 12/14/2018 09/02/2018 09/10/2017  Falls in the past year? 0 0 0 No  Number falls in past yr: - 0 - -  Injury with Fall? - 0 - -  Follow up Falls evaluation completed - - -     Depression screen [name] 2/9 03/15/2019 12/14/2018 09/10/2017  Decreased Interest 0 0 0  Down, Depressed, Hopeless 0 0 0  PHQ - 2 Score 0 0 0    No Known Allergies  Prior to Admission medications   Medication Sig Start Date End Date Taking? Authorizing Provider  amphetamine-dextroamphetamine (ADDERALL XR) 25 MG 24 hr capsule Take 1 capsule by mouth every morning. Ok to fill 60 days after signature 12/14/18  Yes Myles Lipps, MD  amphetamine-dextroamphetamine (ADDERALL) 10 MG tablet Take 1 tablet (10 mg total) by mouth daily with breakfast. 12/14/18  Yes Myles Lipps, MD  amphetamine-dextroamphetamine (ADDERALL XR) 25 MG 24 hr capsule  Take 1 capsule by mouth every morning. Ok to fill 30 days after signature Patient not taking: Reported on 03/15/2019 12/14/18   Myles Lipps, MD  amphetamine-dextroamphetamine (ADDERALL XR) 25 MG 24 hr capsule Take 1 capsule by mouth every morning. Patient not taking: Reported on 03/15/2019 02/20/19   Myles Lipps, MD  amphetamine-dextroamphetamine (ADDERALL) 10 MG tablet Take 1 tablet (10 mg total) by mouth daily for 30 days. Ok to fill 60 days after signature 12/14/18 01/13/19  Myles Lipps, MD  amphetamine-dextroamphetamine (ADDERALL) 10 MG tablet Take 1 tablet (10 mg total) by mouth daily with breakfast. Ok to fill 30 days after signature Patient not taking: Reported on 03/15/2019 02/20/19   Myles Lipps, MD  CLARAVIS 40 MG capsule  08/03/18   [provider]  desogestrel-ethinyl estradiol (ISIBLOOM) 0.15-30 MG-MCG tablet Take 1 tablet by mouth daily.    [provider]  norethindrone-ethinyl estradiol-iron (MICROGESTIN FE,GILDESS FE,LOESTRIN FE) 1.5-30 MG-MCG tablet Take 1 tablet by mouth daily. For 84 consecutive tablets then take 7 days off, then continue 84 on and 7 off to control dysmenorrhea 02/12/13 03/16/14  Tonye Pearson, MD    Past Medical History:  Diagnosis Date  . ADHD (attention deficit hyperactivity disorder)   . Depression     Past Surgical History:  Procedure Laterality Date  . NO PAST SURGERIES      Social History   Tobacco Use  . Smoking status: Never Smoker  . Smokeless tobacco: Never Used  Substance Use Topics  . Alcohol use: No    Comment: 24 oz    No family history on file.  ROS Per hpi  OBJECTIVE:   Today's Vitals   03/15/19 0850  BP: 135/75  Pulse: 68  Resp: 18  Temp: 98.8 F (37.1 C)  TempSrc: Oral  SpO2: 99%  Weight: 146 lb 6.4 oz (66.4 kg)  Height: 5\' 5"  (1.651 m)   Body mass index is 24.36 kg/m.   Physical Exam Vitals signs and nursing note reviewed.  Constitutional:      Appearance: She is  well-developed.  HENT:     Head: Normocephalic and atraumatic.  Eyes:     General: No scleral icterus.    Conjunctiva/sclera: Conjunctivae normal.     Pupils: Pupils are equal, round, and reactive to light.  Neck:     Musculoskeletal: Neck supple.  Pulmonary:     Effort: Pulmonary effort is normal.  Skin:    General: Skin is warm and dry.  Neurological:     Mental Status: She is alert and oriented to person, place, and time.     ASSESSMENT and PLAN  1. Attention deficit disorder, unspecified hyperactivity presence 2. Encounter for therapeutic drug monitoring Medication needs have changed during covid 19 times. rx for IR 20mg  BID for most days of the week, with prn use of ER 25mg  on longer days.  - ToxASSURE Select 13 (MW), Urine  3. Screening-pulmonary TB She will come in for nurse TB test read in 48-72 hours. - TB Skin Test  Other orders - amphetamine-dextroamphetamine (ADDERALL) 20 MG tablet; Take 1 tablet (20 mg total) by mouth 2 (two) times daily with a meal for 30 days. - amphetamine-dextroamphetamine (ADDERALL XR) 25 MG 24 hr capsule; Take 1 capsule by mouth daily as needed. - amphetamine-dextroamphetamine (ADDERALL XR) 25 MG 24 hr capsule; Take 1 capsule by mouth daily as needed. - amphetamine-dextroamphetamine (ADDERALL XR) 25 MG 24 hr capsule; Take 1 capsule by mouth daily as needed. - amphetamine-dextroamphetamine (ADDERALL) 20 MG tablet; Take 1 tablet (20 mg total) by mouth 2 (two) times daily. Ok to fill 30 days after signature - amphetamine-dextroamphetamine (ADDERALL) 20 MG tablet; Take 1 tablet (20 mg total) by mouth 2 (two) times daily. Ok to fill 60 days after signature  Return in about 3 months (around 06/15/2019).    Myles LippsIrma M Santiago, MD [facility] at [facility] [address] [place], [place] [address] Ph.  [phone] Fax [phone]

## 2019-03-17 ENCOUNTER — Ambulatory Visit (INDEPENDENT_AMBULATORY_CARE_PROVIDER_SITE_OTHER): Payer: Managed Care, Other (non HMO) | Admitting: Family Medicine

## 2019-03-17 ENCOUNTER — Other Ambulatory Visit: Payer: Self-pay

## 2019-03-17 DIAGNOSIS — Z111 Encounter for screening for respiratory tuberculosis: Secondary | ICD-10-CM

## 2019-03-17 LAB — TB SKIN TEST
Induration: 0 mm
TB Skin Test: NEGATIVE

## 2019-03-19 LAB — TOXASSURE SELECT 13 (MW), URINE

## 2019-06-15 ENCOUNTER — Telehealth: Payer: Self-pay | Admitting: Family Medicine

## 2019-06-15 ENCOUNTER — Ambulatory Visit: Payer: Managed Care, Other (non HMO) | Admitting: Family Medicine

## 2019-06-15 NOTE — Telephone Encounter (Signed)
Spoke with patient Correct meds and dose sent in may to correct pharmacy for total of 3 months Not sure what happened She will call pharmacy tomorrow and make as it seems they went off old rx If not then I will resend pmp reviewed

## 2019-06-15 NOTE — Telephone Encounter (Signed)
Pt states adderall dosage was suppose to be different and the prescription sent was the same and she does not want to come in for an appt to discuss medication when nothing has changed and she has nothing to discuss.  Pt would like a cb in regards to her dosage.

## 2019-06-15 NOTE — Telephone Encounter (Signed)
Pt is stating the adderall dosage was suppose to be different and the prescription sent was the same

## 2019-07-05 ENCOUNTER — Encounter: Payer: Self-pay | Admitting: Family Medicine

## 2020-01-10 ENCOUNTER — Encounter: Payer: Self-pay | Admitting: Family Medicine

## 2020-02-06 ENCOUNTER — Encounter: Payer: Self-pay | Admitting: Family Medicine

## 2020-03-06 ENCOUNTER — Other Ambulatory Visit: Payer: Self-pay | Admitting: Family Medicine

## 2020-03-06 NOTE — Telephone Encounter (Signed)
Last office visit 03/17/2019 request for adderall prescriptions, reached out to patient to inform an appointmentt is required.

## 2020-03-07 MED ORDER — AMPHETAMINE-DEXTROAMPHETAMINE 20 MG PO TABS: 20.0000 mg | ORAL_TABLET | Freq: Two times a day (BID) | ORAL | 0 refills | Status: DC

## 2020-03-07 MED ORDER — AMPHETAMINE-DEXTROAMPHET ER 25 MG PO CP24: 25.0000 mg | ORAL_CAPSULE | Freq: Every day | ORAL | 0 refills | Status: DC | PRN

## 2020-03-07 NOTE — Telephone Encounter (Signed)
pmp reviewed 30 day rx sent Needs OV for further refills

## 2020-04-17 LAB — OB RESULTS CONSOLE HEPATITIS B SURFACE ANTIGEN: Hepatitis B Surface Ag: NEGATIVE

## 2020-04-17 LAB — OB RESULTS CONSOLE RUBELLA ANTIBODY, IGM: Rubella: IMMUNE

## 2020-04-17 LAB — OB RESULTS CONSOLE RPR: RPR: NONREACTIVE

## 2020-08-27 LAB — OB RESULTS CONSOLE HIV ANTIBODY (ROUTINE TESTING): HIV: NONREACTIVE

## 2020-10-24 ENCOUNTER — Inpatient Hospital Stay (HOSPITAL_COMMUNITY)
Admission: AD | Admit: 2020-10-24 | Discharge: 2020-10-24 | Disposition: A | Discharge status: Home / Self Care | Payer: BC Managed Care – PPO | Attending: Obstetrics and Gynecology | Admitting: Obstetrics and Gynecology

## 2020-10-24 ENCOUNTER — Encounter (HOSPITAL_COMMUNITY): Payer: Self-pay | Admitting: *Deleted

## 2020-10-24 ENCOUNTER — Other Ambulatory Visit: Payer: Self-pay

## 2020-10-24 DIAGNOSIS — O26893 Other specified pregnancy related conditions, third trimester: Secondary | ICD-10-CM | POA: Insufficient documentation

## 2020-10-24 DIAGNOSIS — K5900 Constipation, unspecified: Secondary | ICD-10-CM | POA: Insufficient documentation

## 2020-10-24 DIAGNOSIS — O99343 Other mental disorders complicating pregnancy, third trimester: Secondary | ICD-10-CM | POA: Insufficient documentation

## 2020-10-24 DIAGNOSIS — Z7982 Long term (current) use of aspirin: Secondary | ICD-10-CM | POA: Diagnosis not present

## 2020-10-24 DIAGNOSIS — R1909 Other intra-abdominal and pelvic swelling, mass and lump: Secondary | ICD-10-CM | POA: Diagnosis not present

## 2020-10-24 DIAGNOSIS — Z3A35 35 weeks gestation of pregnancy: Secondary | ICD-10-CM | POA: Insufficient documentation

## 2020-10-24 DIAGNOSIS — F909 Attention-deficit hyperactivity disorder, unspecified type: Secondary | ICD-10-CM | POA: Insufficient documentation

## 2020-10-24 DIAGNOSIS — O99613 Diseases of the digestive system complicating pregnancy, third trimester: Secondary | ICD-10-CM | POA: Insufficient documentation

## 2020-10-24 DIAGNOSIS — Z79899 Other long term (current) drug therapy: Secondary | ICD-10-CM | POA: Diagnosis not present

## 2020-10-24 DIAGNOSIS — N9489 Other specified conditions associated with female genital organs and menstrual cycle: Secondary | ICD-10-CM | POA: Diagnosis not present

## 2020-10-24 DIAGNOSIS — R102 Pelvic and perineal pain: Secondary | ICD-10-CM

## 2020-10-24 DIAGNOSIS — Z3689 Encounter for other specified antenatal screening: Secondary | ICD-10-CM

## 2020-10-24 LAB — URINALYSIS, ROUTINE W REFLEX MICROSCOPIC
Bilirubin Urine: NEGATIVE
Glucose, UA: NEGATIVE mg/dL
Hgb urine dipstick: NEGATIVE
Ketones, ur: 20 mg/dL — AB
Leukocytes,Ua: NEGATIVE
Nitrite: NEGATIVE
Protein, ur: NEGATIVE mg/dL
Specific Gravity, Urine: 1.009 (ref 1.005–1.030)
pH: 7 (ref 5.0–8.0)

## 2020-10-24 LAB — WET PREP, GENITAL
Clue Cells Wet Prep HPF POC: NONE SEEN
Sperm: NONE SEEN
Trich, Wet Prep: NONE SEEN
Yeast Wet Prep HPF POC: NONE SEEN

## 2020-10-24 NOTE — MAU Provider Note (Signed)
History     CSN: [id number]  Arrival date and time: 10/24/20 1740   Event Date/Time   First Provider Initiated Contact with Patient 10/24/20 1804      Chief Complaint  Patient presents with  . Groin Swelling   24 y.o. G3P1011 @35 .1 wks presenting with vaginal pressure. Reports onset of sx 1 month ago. Recently she noticed something purple protruding from vagina. Denies urinary sx. No difficulty urinating but thinks she may not be emptying bladder fully. Also reports occasional constipation with straining. No pain with sex. Reports good FM. No VB or LOF. Occasional BH ctx. Denies vaginal itching or malodor.   OB History    Gravida  3   Para  1   Term  1   Preterm      AB  1   Living  1     SAB  1   IAB      Ectopic      Multiple  0   Live Births  1           Past Medical History:  Diagnosis Date  . ADHD (attention deficit hyperactivity disorder)   . Depression     Past Surgical History:  Procedure Laterality Date  . NO PAST SURGERIES      History reviewed. No pertinent family history.  Social History   Tobacco Use  . Smoking status: Never Smoker  . Smokeless tobacco: Never Used  Vaping Use  . Vaping Use: Never used  Substance Use Topics  . Alcohol use: No    Comment: 24 oz  . Drug use: No    Allergies: No Known Allergies  Medications Prior to Admission  Medication Sig Dispense Refill Last Dose  . aspirin EC 81 MG tablet Take 81 mg by mouth daily. Swallow whole.   10/24/2020 at Unknown time  . ferrous sulfate 325 (65 FE) MG tablet Take 325 mg by mouth daily with breakfast.   10/24/2020 at Unknown time  . Prenatal Vit-Fe Fumarate-FA (MULTIVITAMIN-PRENATAL) 27-0.8 MG TABS tablet Take 1 tablet by mouth daily at 12 noon.   10/24/2020 at Unknown time  . amphetamine-dextroamphetamine (ADDERALL XR) 25 MG 24 hr capsule Take 1 capsule by mouth daily as needed. 15 capsule 0 More than a month at Unknown time  . amphetamine-dextroamphetamine  (ADDERALL XR) 25 MG 24 hr capsule Take 1 capsule by mouth daily as needed. 15 capsule 0 More than a month at Unknown time  . amphetamine-dextroamphetamine (ADDERALL XR) 25 MG 24 hr capsule Take 1 capsule by mouth daily as needed. Office visit needed for further refills 15 capsule 0 More than a month at Unknown time  . amphetamine-dextroamphetamine (ADDERALL) 20 MG tablet Take 1 tablet (20 mg total) by mouth 2 (two) times daily. Ok to fill 30 days after signature 60 tablet 0 More than a month at Unknown time  . amphetamine-dextroamphetamine (ADDERALL) 20 MG tablet Take 1 tablet (20 mg total) by mouth 2 (two) times daily. Ok to fill 60 days after signature 60 tablet 0 More than a month at Unknown time  . amphetamine-dextroamphetamine (ADDERALL) 20 MG tablet Take 1 tablet (20 mg total) by mouth 2 (two) times daily with a meal. Office visit needed for further refills. 60 tablet 0   . CLARAVIS 40 MG capsule   0   . desogestrel-ethinyl estradiol (APRI) 0.15-30 MG-MCG tablet Take 1 tablet by mouth daily.       Review of Systems  Gastrointestinal: Positive for constipation. Negative for  abdominal pain.  Genitourinary: Negative for dysuria, frequency, hematuria, pelvic pain, urgency, vaginal bleeding, vaginal discharge and vaginal pain.   Physical Exam   Blood pressure 130/82, pulse 96, temperature 97.9 F (36.6 C), resp. rate 16, weight 83.2 kg, SpO2 99 %.  Physical Exam Vitals and nursing note reviewed. Exam conducted with a chaperone present.  Constitutional:      Appearance: Normal appearance.  HENT:     Head: Normocephalic and atraumatic.  Cardiovascular:     Rate and Rhythm: Normal rate.  Pulmonary:     Effort: Pulmonary effort is normal. No respiratory distress.  Abdominal:     Palpations: Abdomen is soft.     Tenderness: There is no abdominal tenderness.     Comments: gravid  Genitourinary:    Comments: External: no lesions or erythema Vagina: rugated, pink, moist, thin white  discharge, no prolapse Cervix closed/thick/high  Musculoskeletal:        General: Normal range of motion.     Cervical back: Normal range of motion.  Skin:    General: Skin is warm.  Neurological:     General: No focal deficit present.     Mental Status: She is alert.   EFM: 150 bpm, mod variability, + accels, no decels Toco: UI  BSUS: minimal fluid in bladder post void  Results for orders placed or performed during the hospital encounter of 10/24/20 (from the past 24 hour(s))  Urinalysis, Routine w reflex microscopic Urine, Clean Catch     Status: Abnormal   Collection Time: 10/24/20  6:17 PM  Result Value Ref Range   Color, Urine YELLOW YELLOW   APPearance HAZY (A) CLEAR   Specific Gravity, Urine 1.009 1.005 - 1.030   pH 7.0 5.0 - 8.0   Glucose, UA NEGATIVE NEGATIVE mg/dL   Hgb urine dipstick NEGATIVE NEGATIVE   Bilirubin Urine NEGATIVE NEGATIVE   Ketones, ur 20 (A) NEGATIVE mg/dL   Protein, ur NEGATIVE NEGATIVE mg/dL   Nitrite NEGATIVE NEGATIVE   Leukocytes,Ua NEGATIVE NEGATIVE  Wet prep, genital     Status: Abnormal   Collection Time: 10/24/20  6:45 PM  Result Value Ref Range   Yeast Wet Prep HPF POC NONE SEEN NONE SEEN   Trich, Wet Prep NONE SEEN NONE SEEN   Clue Cells Wet Prep HPF POC NONE SEEN NONE SEEN   WBC, Wet Prep HPF POC MANY (A) NONE SEEN   Sperm NONE SEEN    MAU Course  Procedures  MDM Labs ordered and reviewed. No signs of UTI, PTL, cervical prolapse, cystocele or rectocele. Could be having intermittent protrusion of vaginal tissue from fetal head compression exacerbated by standing and straining. Recommend maternity belt and off feet when possible. Notify MD if unable to void or if pain develops. Stable for discharge home.   Assessment and Plan   1. [redacted] weeks gestation of pregnancy   2. NST (non-stress test) reactive   3. Pelvic pressure in pregnancy, antepartum, third trimester    Discharge home Follow up with [facility] next week PTL  precautions  Allergies as of 10/24/2020   No Known Allergies     Medication List    STOP taking these medications   amphetamine-dextroamphetamine 20 MG tablet Commonly known as: ADDERALL   amphetamine-dextroamphetamine 25 MG 24 hr capsule Commonly known as: ADDERALL XR   Claravis 40 MG capsule Generic drug: ISOtretinoin   desogestrel-ethinyl estradiol 0.15-30 MG-MCG tablet Commonly known as: APRI     TAKE these medications   aspirin  EC 81 MG tablet Take 81 mg by mouth daily. Swallow whole.   ferrous sulfate 325 (65 FE) MG tablet Take 325 mg by mouth daily with breakfast.   multivitamin-prenatal 27-0.8 MG Tabs tablet Take 1 tablet by mouth daily at 12 noon.      Donette Larry, CNM 10/24/2020, 6:17 PM

## 2020-10-24 NOTE — MAU Note (Signed)
319 mL per bladder scanner after patient voided. Per Efraim Kaufmann, RN  Provider Fabian November, CNM made aware.

## 2020-10-24 NOTE — Discharge Instructions (Signed)
Braxton Hicks Contractions °Contractions of the uterus can occur throughout pregnancy, but they are not always a sign that you are in labor. You may have practice contractions called Braxton Hicks contractions. These false labor contractions are sometimes confused with true labor. °What are Braxton Hicks contractions? °Braxton Hicks contractions are tightening movements that occur in the muscles of the uterus before labor. Unlike true labor contractions, these contractions do not result in opening (dilation) and thinning of the cervix. Toward the end of pregnancy (32-34 weeks), Braxton Hicks contractions can happen more often and may become stronger. These contractions are sometimes difficult to tell apart from true labor because they can be very uncomfortable. You should not feel embarrassed if you go to the hospital with false labor. °Sometimes, the only way to tell if you are in true labor is for your health care provider to look for changes in the cervix. The health care provider will do a physical exam and may monitor your contractions. If you are not in true labor, the exam should show that your cervix is not dilating and your water has not broken. °If there are no other health problems associated with your pregnancy, it is completely safe for you to be sent home with false labor. You may continue to have Braxton Hicks contractions until you go into true labor. °How to tell the difference between true labor and false labor °True labor °· Contractions last 30-70 seconds. °· Contractions become very regular. °· Discomfort is usually felt in the top of the uterus, and it spreads to the lower abdomen and low back. °· Contractions do not go away with walking. °· Contractions usually become more intense and increase in frequency. °· The cervix dilates and gets thinner. °False labor °· Contractions are usually shorter and not as strong as true labor contractions. °· Contractions are usually irregular. °· Contractions  are often felt in the front of the lower abdomen and in the groin. °· Contractions may go away when you walk around or change positions while lying down. °· Contractions get weaker and are shorter-lasting as time goes on. °· The cervix usually does not dilate or become thin. °Follow these instructions at home: ° °· Take over-the-counter and prescription medicines only as told by your health care provider. °· Keep up with your usual exercises and follow other instructions from your health care provider. °· Eat and drink lightly if you think you are going into labor. °· If Braxton Hicks contractions are making you uncomfortable: °? Change your position from lying down or resting to walking, or change from walking to resting. °? Sit and rest in a tub of warm water. °? Drink enough fluid to keep your urine pale yellow. Dehydration may cause these contractions. °? Do slow and deep breathing several times an hour. °· Keep all follow-up prenatal visits as told by your health care provider. This is important. °Contact a health care provider if: °· You have a fever. °· You have continuous pain in your abdomen. °Get help right away if: °· Your contractions become stronger, more regular, and closer together. °· You have fluid leaking or gushing from your vagina. °· You pass blood-tinged mucus (bloody show). °· You have bleeding from your vagina. °· You have low back pain that you never had before. °· You feel your baby’s head pushing down and causing pelvic pressure. °· Your baby is not moving inside you as much as it used to. °Summary °· Contractions that occur before labor are   called Braxton Hicks contractions, false labor, or practice contractions. °· Braxton Hicks contractions are usually shorter, weaker, farther apart, and less regular than true labor contractions. True labor contractions usually become progressively stronger and regular, and they become more frequent. °· Manage discomfort from Braxton Hicks contractions  by changing position, resting in a warm bath, drinking plenty of water, or practicing deep breathing. °This information is not intended to replace advice given to you by your health care provider. Make sure you discuss any questions you have with your health care provider. °Document Revised: 10/01/2017 Document Reviewed: 03/04/2017 °Elsevier Patient Education © 2020 [employer] ° °

## 2020-10-24 NOTE — MAU Note (Signed)
Pt reports vaginal pressure for weeks. Pt reports that she looked at her vagina tonight and saw that it was bulging and purple.    Reports +FM  Denies LOF or vaginal bleeding.

## 2020-10-28 LAB — GC/CHLAMYDIA PROBE AMP (~~LOC~~) NOT AT ARMC
Chlamydia: NEGATIVE
Comment: NEGATIVE
Comment: NORMAL
Neisseria Gonorrhea: NEGATIVE

## 2020-10-31 LAB — OB RESULTS CONSOLE GC/CHLAMYDIA
Chlamydia: NEGATIVE
Gonorrhea: NEGATIVE

## 2020-10-31 LAB — OB RESULTS CONSOLE GBS: GBS: NEGATIVE

## 2020-11-02 NOTE — L&D Delivery Note (Signed)
Delivery Note  SVD viable female Apgars 9,9 over 1st degree ML lac.  Placenta delivered spontaneously intact with 3VC.  Nuchal x 1 reduced on perineum Repair with 3-0 Chromic with good support and hemostasis noted.  R/V exam confirms.  PH art was sent.   Mother and baby to couplet care and are doing well.  EBL 150cc  Candice Camp, MD

## 2020-11-07 ENCOUNTER — Encounter (HOSPITAL_COMMUNITY): Payer: Self-pay | Admitting: *Deleted

## 2020-11-07 ENCOUNTER — Telehealth (HOSPITAL_COMMUNITY): Payer: Self-pay | Admitting: *Deleted

## 2020-11-07 NOTE — Telephone Encounter (Signed)
Preadmission screen  

## 2020-11-19 ENCOUNTER — Other Ambulatory Visit (HOSPITAL_COMMUNITY)
Admission: RE | Admit: 2020-11-19 | Discharge: 2020-11-19 | Disposition: A | Discharge status: Home / Self Care | Payer: BC Managed Care – PPO | Source: Ambulatory Visit | Attending: Obstetrics and Gynecology | Admitting: Obstetrics and Gynecology

## 2020-11-19 DIAGNOSIS — Z01812 Encounter for preprocedural laboratory examination: Secondary | ICD-10-CM | POA: Insufficient documentation

## 2020-11-19 DIAGNOSIS — Z20822 Contact with and (suspected) exposure to covid-19: Secondary | ICD-10-CM | POA: Insufficient documentation

## 2020-11-19 LAB — SARS CORONAVIRUS 2 (TAT 6-24 HRS): SARS Coronavirus 2: NEGATIVE

## 2020-11-21 ENCOUNTER — Inpatient Hospital Stay (HOSPITAL_COMMUNITY): Payer: BC Managed Care – PPO | Admitting: Anesthesiology

## 2020-11-21 ENCOUNTER — Inpatient Hospital Stay (HOSPITAL_COMMUNITY)
Admission: AD | Admit: 2020-11-21 | Discharge: 2020-11-22 | DRG: 807 | Disposition: A | Discharge status: Home / Self Care | Payer: BC Managed Care – PPO | Attending: Obstetrics and Gynecology | Admitting: Obstetrics and Gynecology

## 2020-11-21 ENCOUNTER — Encounter (HOSPITAL_COMMUNITY): Payer: Self-pay | Admitting: Obstetrics and Gynecology

## 2020-11-21 ENCOUNTER — Inpatient Hospital Stay (HOSPITAL_COMMUNITY): Payer: BC Managed Care – PPO

## 2020-11-21 ENCOUNTER — Other Ambulatory Visit: Payer: Self-pay

## 2020-11-21 DIAGNOSIS — O26893 Other specified pregnancy related conditions, third trimester: Secondary | ICD-10-CM | POA: Diagnosis present

## 2020-11-21 DIAGNOSIS — Z3A39 39 weeks gestation of pregnancy: Secondary | ICD-10-CM | POA: Diagnosis not present

## 2020-11-21 DIAGNOSIS — Z3490 Encounter for supervision of normal pregnancy, unspecified, unspecified trimester: Secondary | ICD-10-CM | POA: Diagnosis present

## 2020-11-21 DIAGNOSIS — Z20822 Contact with and (suspected) exposure to covid-19: Secondary | ICD-10-CM | POA: Diagnosis present

## 2020-11-21 LAB — CBC
HCT: 33 % — ABNORMAL LOW (ref 36.0–46.0)
Hemoglobin: 11.5 g/dL — ABNORMAL LOW (ref 12.0–15.0)
MCH: 31 pg (ref 26.0–34.0)
MCHC: 34.8 g/dL (ref 30.0–36.0)
MCV: 88.9 fL (ref 80.0–100.0)
Platelets: 204 10*3/uL (ref 150–400)
RBC: 3.71 MIL/uL — ABNORMAL LOW (ref 3.87–5.11)
RDW: 13.6 % (ref 11.5–15.5)
WBC: 9.2 10*3/uL (ref 4.0–10.5)
nRBC: 0 % (ref 0.0–0.2)

## 2020-11-21 LAB — TYPE AND SCREEN
ABO/RH(D): A POS
Antibody Screen: NEGATIVE

## 2020-11-21 LAB — RPR: RPR Ser Ql: NONREACTIVE

## 2020-11-21 MED ORDER — MEDROXYPROGESTERONE ACETATE 150 MG/ML IM SUSP: 150.0000 mg | INTRAMUSCULAR | Status: DC | PRN

## 2020-11-21 MED ORDER — ONDANSETRON HCL 4 MG/2ML IJ SOLN
4.0000 mg | Freq: Four times a day (QID) | INTRAMUSCULAR | Status: DC | PRN
  Administered 2020-11-21: 4 mg via INTRAVENOUS
  Filled 2020-11-21: qty 2

## 2020-11-21 MED ORDER — EPHEDRINE 5 MG/ML INJ: 10.0000 mg | INTRAVENOUS | Status: DC | PRN

## 2020-11-21 MED ORDER — DIPHENHYDRAMINE HCL 25 MG PO CAPS: 25.0000 mg | ORAL_CAPSULE | Freq: Four times a day (QID) | ORAL | Status: DC | PRN

## 2020-11-21 MED ORDER — BUTORPHANOL TARTRATE 1 MG/ML IJ SOLN: 1.0000 mg | INTRAMUSCULAR | Status: DC | PRN

## 2020-11-21 MED ORDER — TERBUTALINE SULFATE 1 MG/ML IJ SOLN: 0.2500 mg | Freq: Once | INTRAMUSCULAR | Status: DC | PRN

## 2020-11-21 MED ORDER — SIMETHICONE 80 MG PO CHEW
80.0000 mg | CHEWABLE_TABLET | ORAL | Status: DC | PRN
Start: 2020-11-21 — End: 2020-11-22

## 2020-11-21 MED ORDER — TETANUS-DIPHTH-ACELL PERTUSSIS 5-2.5-18.5 LF-MCG/0.5 IM SUSY: 0.5000 mL | PREFILLED_SYRINGE | Freq: Once | INTRAMUSCULAR | Status: DC

## 2020-11-21 MED ORDER — SODIUM CHLORIDE (PF) 0.9 % IJ SOLN
INTRAMUSCULAR | Status: DC | PRN
  Administered 2020-11-21: 12 mL/h via EPIDURAL

## 2020-11-21 MED ORDER — ACETAMINOPHEN 325 MG PO TABS: 650.0000 mg | ORAL_TABLET | ORAL | Status: DC | PRN

## 2020-11-21 MED ORDER — WITCH HAZEL-GLYCERIN EX PADS
1.0000 "application " | MEDICATED_PAD | CUTANEOUS | Status: DC | PRN
  Administered 2020-11-21: 1 via TOPICAL

## 2020-11-21 MED ORDER — DIPHENHYDRAMINE HCL 50 MG/ML IJ SOLN
12.5000 mg | INTRAMUSCULAR | Status: DC | PRN
Start: 2020-11-21 — End: 2020-11-21

## 2020-11-21 MED ORDER — LIDOCAINE HCL (PF) 1 % IJ SOLN: 30.0000 mL | INTRAMUSCULAR | Status: DC | PRN

## 2020-11-21 MED ORDER — OXYTOCIN-SODIUM CHLORIDE 30-0.9 UT/500ML-% IV SOLN
1.0000 m[IU]/min | INTRAVENOUS | Status: DC
  Administered 2020-11-21: 2 m[IU]/min via INTRAVENOUS

## 2020-11-21 MED ORDER — MEASLES, MUMPS & RUBELLA VAC IJ SOLR: 0.5000 mL | Freq: Once | INTRAMUSCULAR | Status: DC

## 2020-11-21 MED ORDER — SOD CITRATE-CITRIC ACID 500-334 MG/5ML PO SOLN
30.0000 mL | ORAL | Status: DC | PRN
Start: 2020-11-21 — End: 2020-11-21

## 2020-11-21 MED ORDER — BENZOCAINE-MENTHOL 20-0.5 % EX AERO
1.0000 "application " | INHALATION_SPRAY | CUTANEOUS | Status: DC | PRN
  Administered 2020-11-21: 1 via TOPICAL
  Filled 2020-11-21: qty 56

## 2020-11-21 MED ORDER — FENTANYL-BUPIVACAINE-NACL 0.5-0.125-0.9 MG/250ML-% EP SOLN
12.0000 mL/h | EPIDURAL | Status: DC | PRN
  Filled 2020-11-21: qty 250

## 2020-11-21 MED ORDER — LIDOCAINE HCL (PF) 1 % IJ SOLN
INTRAMUSCULAR | Status: DC | PRN
  Administered 2020-11-21: 10 mL via EPIDURAL

## 2020-11-21 MED ORDER — LACTATED RINGERS IV SOLN: INTRAVENOUS | Status: DC

## 2020-11-21 MED ORDER — PHENYLEPHRINE 40 MCG/ML (10ML) SYRINGE FOR IV PUSH (FOR BLOOD PRESSURE SUPPORT): 80.0000 ug | PREFILLED_SYRINGE | INTRAVENOUS | Status: DC | PRN

## 2020-11-21 MED ORDER — IBUPROFEN 600 MG PO TABS
600.0000 mg | ORAL_TABLET | Freq: Four times a day (QID) | ORAL | Status: DC
  Administered 2020-11-21 – 2020-11-22 (×3): 600 mg via ORAL
  Filled 2020-11-21 (×3): qty 1

## 2020-11-21 MED ORDER — OXYCODONE-ACETAMINOPHEN 5-325 MG PO TABS: 2.0000 | ORAL_TABLET | ORAL | Status: DC | PRN

## 2020-11-21 MED ORDER — LACTATED RINGERS IV SOLN: 500.0000 mL | INTRAVENOUS | Status: DC | PRN

## 2020-11-21 MED ORDER — OXYTOCIN-SODIUM CHLORIDE 30-0.9 UT/500ML-% IV SOLN
2.5000 [IU]/h | INTRAVENOUS | Status: DC
  Filled 2020-11-21: qty 500

## 2020-11-21 MED ORDER — PRENATAL MULTIVITAMIN CH
1.0000 | ORAL_TABLET | Freq: Every day | ORAL | Status: DC
  Administered 2020-11-22: 1 via ORAL
  Filled 2020-11-21: qty 1

## 2020-11-21 MED ORDER — OXYTOCIN BOLUS FROM INFUSION
333.0000 mL | Freq: Once | INTRAVENOUS | Status: AC
  Administered 2020-11-21: 333 mL via INTRAVENOUS

## 2020-11-21 MED ORDER — DIBUCAINE (PERIANAL) 1 % EX OINT: 1.0000 "application " | TOPICAL_OINTMENT | CUTANEOUS | Status: DC | PRN

## 2020-11-21 MED ORDER — MISOPROSTOL 25 MCG QUARTER TABLET
25.0000 ug | ORAL_TABLET | ORAL | Status: DC | PRN
  Administered 2020-11-21 (×2): 25 ug via VAGINAL
  Filled 2020-11-21 (×2): qty 1

## 2020-11-21 MED ORDER — OXYCODONE-ACETAMINOPHEN 5-325 MG PO TABS: 1.0000 | ORAL_TABLET | ORAL | Status: DC | PRN

## 2020-11-21 MED ORDER — ONDANSETRON HCL 4 MG PO TABS: 4.0000 mg | ORAL_TABLET | ORAL | Status: DC | PRN

## 2020-11-21 MED ORDER — OXYCODONE-ACETAMINOPHEN 5-325 MG PO TABS
1.0000 | ORAL_TABLET | ORAL | Status: DC | PRN
Start: 2020-11-21 — End: 2020-11-22

## 2020-11-21 MED ORDER — ONDANSETRON HCL 4 MG/2ML IJ SOLN: 4.0000 mg | INTRAMUSCULAR | Status: DC | PRN

## 2020-11-21 MED ORDER — LACTATED RINGERS IV SOLN
500.0000 mL | Freq: Once | INTRAVENOUS | Status: AC
  Administered 2020-11-21: 500 mL via INTRAVENOUS

## 2020-11-21 MED ORDER — ZOLPIDEM TARTRATE 5 MG PO TABS
5.0000 mg | ORAL_TABLET | Freq: Every evening | ORAL | Status: DC | PRN
Start: 2020-11-21 — End: 2020-11-22

## 2020-11-21 MED ORDER — COCONUT OIL OIL: 1.0000 "application " | TOPICAL_OIL | Status: DC | PRN

## 2020-11-21 MED ORDER — SENNOSIDES-DOCUSATE SODIUM 8.6-50 MG PO TABS
2.0000 | ORAL_TABLET | Freq: Every day | ORAL | Status: DC
  Administered 2020-11-22: 2 via ORAL
  Filled 2020-11-21: qty 2

## 2020-11-21 NOTE — Anesthesia Procedure Notes (Signed)
Epidural Patient location during procedure: OB Start time: 11/21/2020 9:07 AM End time: 11/21/2020 9:17 AM  Staffing Anesthesiologist: Lucretia Kern, MD Performed: anesthesiologist   Preanesthetic Checklist Completed: patient identified, IV checked, risks and benefits discussed, monitors and equipment checked, pre-op evaluation and timeout performed  Epidural Patient position: sitting Prep: DuraPrep Patient monitoring: heart rate, continuous pulse ox and blood pressure Approach: midline Location: L3-L4 Injection technique: LOR air  Needle:  Needle type: Tuohy  Needle gauge: 17 G Needle length: 9 cm Needle insertion depth: 6 cm Catheter type: closed end flexible Catheter size: 19 Gauge Catheter at skin depth: 11 cm Test dose: negative  Assessment Events: blood not aspirated, injection not painful, no injection resistance, no paresthesia and negative IV test  Additional Notes Reason for block:procedure for pain

## 2020-11-21 NOTE — Anesthesia Preprocedure Evaluation (Signed)
Anesthesia Evaluation  Patient identified by MRN, date of birth, ID band Patient awake    Reviewed: Allergy & Precautions, H&P , NPO status , Patient's Chart, lab work & pertinent test results  History of Anesthesia Complications Negative for: history of anesthetic complications  Airway Mallampati: II  TM Distance: >3 FB Neck ROM: full    Dental no notable dental hx.    Pulmonary neg pulmonary ROS,    Pulmonary exam normal        Cardiovascular hypertension (gestational), Normal cardiovascular exam Rhythm:regular Rate:Normal     Neuro/Psych negative neurological ROS  negative psych ROS   GI/Hepatic negative GI ROS, Neg liver ROS,   Endo/Other  negative endocrine ROS  Renal/GU negative Renal ROS  negative genitourinary   Musculoskeletal   Abdominal   Peds  Hematology negative hematology ROS (+)   Anesthesia Other Findings   Reproductive/Obstetrics (+) Pregnancy                             Anesthesia Physical Anesthesia Plan  ASA: II  Anesthesia Plan: Epidural   Post-op Pain Management:    Induction:   PONV Risk Score and Plan:   Airway Management Planned:   Additional Equipment:   Intra-op Plan:   Post-operative Plan:   Informed Consent: I have reviewed the patients History and Physical, chart, labs and discussed the procedure including the risks, benefits and alternatives for the proposed anesthesia with the patient or authorized representative who has indicated his/her understanding and acceptance.       Plan Discussed with:   Anesthesia Plan Comments:         Anesthesia Quick Evaluation  

## 2020-11-21 NOTE — Lactation Note (Signed)
This note was copied from a baby's chart. Lactation Consultation Note  Patient Name: Stacy Myers IEPPI'R Date: 11/21/2020 Reason for consult: Initial assessment;Term Age:24 hours P2, term female infant. Infant had one void diaper since delivery. Mom has DEBP at home and she BF her 1st child who is now 6 years for 2 weeks. Mom had breast augmentation but was leaking colostrum  in her pregnancy. Per mom, infant has been latching well BF 15 minutes  and 2nd feeding was 10 minutes.  Mom attempted to latch infant, infant recently had a bath, to sleepy to latch at this time. Mom hand expressed and infant was given 6 mls of colosotrum by spoon and dad was doing STS with infant as LC left the room.  LC gave mom hand pump to help evert nipple shaft out more to help with latch due to mom being slightly short shafted. Mom shown how to use hand pump  & how to disassemble, clean, & reassemble parts. Mom will continue to latch infant according to cues, 8 to 12+ times within 24 hours, STS. Mom knows if infant doesn't latch to hand express and give infant back volume and do a lot of STS. LC discussed infant's input and out put with parents. Mom knows to call RN or LC  if she needs assistance with latching infant at the breast. Mom made aware of O/P services, breastfeeding support groups, community resources, and our phone # for post-discharge questions.   Maternal Data Formula Feeding for Exclusion: No Does the patient have breastfeeding experience prior to this delivery?: Yes  Feeding Feeding Type: Breast Fed  LATCH Score Latch: Too sleepy or reluctant, no latch achieved, no sucking elicited.  Audible Swallowing: None  Type of Nipple: Everted at rest and after stimulation  Comfort (Breast/Nipple): Soft / non-tender  Hold (Positioning): Assistance needed to correctly position infant at breast and maintain latch.  LATCH Score: 5  Interventions Interventions: Breast feeding  basics reviewed;Assisted with latch;Skin to skin;Adjust position;Breast compression;Support pillows;Breast massage;Hand express;Position options;Pre-pump if needed;Expressed milk;Hand pump  Lactation Tools Discussed/Used WIC Program: No Pump Education: Setup, frequency, and cleaning;Milk Storage Initiated by:: Danelle Earthly, IBCLC Date initiated:: 11/21/20   Consult Status Consult Status: Follow-up Date: 11/22/20 Follow-up type: In-patient    Danelle Earthly 11/21/2020, 8:37 PM

## 2020-11-21 NOTE — H&P (Signed)
Stacy Myers is a 25 y.o. female presenting for IOL for elective reasons.  Pregnancy has been uncomplicated.  GBS neg. OB History    Gravida  2   Para  1   Term  1   Preterm      AB  0   Living  1     SAB  0   IAB      Ectopic      Multiple  0   Live Births  1          Past Medical History:  Diagnosis Date  . ADHD (attention deficit hyperactivity disorder)   . Depression    Past Surgical History:  Procedure Laterality Date  . BREAST SURGERY     augmentation  . NO PAST SURGERIES     Family History: family history is not on file. Social History:  reports that she has never smoked. She has never used smokeless tobacco. She reports that she does not drink alcohol and does not use drugs.     Maternal Diabetes: No Genetic Screening: Normal Maternal Ultrasounds/Referrals: Normal Fetal Ultrasounds or other Referrals:  None Maternal Substance Abuse:  No Significant Maternal Medications:  None Significant Maternal Lab Results:  Group B Strep negative Other Comments:  None  Review of Systems History Dilation: 3 Effacement (%): 80 Station: -2 Exam by:: Dr. Rana Snare Blood pressure 139/86, pulse 82, temperature 98.2 F (36.8 C), temperature source Oral, resp. rate 14, height 5\' 4"  (1.626 m), weight 88.3 kg. Exam Physical Exam  Prenatal labs: ABO, Rh: --/--/A POS (01/20 0011) Antibody: NEG (01/20 0011) Rubella: Immune (06/16 0000) RPR: Nonreactive (06/16 0000)  HBsAg: Negative (06/16 0000)  HIV: Non-reactive (10/26 0000)  GBS: Negative/-- (12/30 0000)   Assessment/Plan: IUP at term IOL - s/p cytotec.  AROM and pitocin and anticpate SVD   09-05-2001 11/21/2020, 9:14 AM

## 2020-11-22 LAB — CBC
HCT: 32.9 % — ABNORMAL LOW (ref 36.0–46.0)
Hemoglobin: 10.8 g/dL — ABNORMAL LOW (ref 12.0–15.0)
MCH: 29.8 pg (ref 26.0–34.0)
MCHC: 32.8 g/dL (ref 30.0–36.0)
MCV: 90.9 fL (ref 80.0–100.0)
Platelets: 187 K/uL (ref 150–400)
RBC: 3.62 MIL/uL — ABNORMAL LOW (ref 3.87–5.11)
RDW: 13.7 % (ref 11.5–15.5)
WBC: 10.3 K/uL (ref 4.0–10.5)
nRBC: 0 % (ref 0.0–0.2)

## 2020-11-22 MED ORDER — METHYLERGONOVINE MALEATE 0.2 MG PO TABS
0.2000 mg | ORAL_TABLET | Freq: Three times a day (TID) | ORAL | Status: DC
  Administered 2020-11-22: 0.2 mg via ORAL
  Filled 2020-11-22: qty 1

## 2020-11-22 MED ORDER — TRANEXAMIC ACID-NACL 1000-0.7 MG/100ML-% IV SOLN
1000.0000 mg | INTRAVENOUS | Status: AC
  Administered 2020-11-22: 1000 mg via INTRAVENOUS
  Filled 2020-11-22: qty 100

## 2020-11-22 NOTE — Lactation Note (Signed)
This note was copied from a baby's chart. Lactation Consultation Note  Patient Name: Stacy Myers NOMVE'H Date: 11/22/2020 Reason for consult: Follow-up assessment Age:24 hours  Baby circumcised today and sleeping on mother's chest.  She recently spoon fed him 6 ml of colostrum. Provided information regarding fussiness and cluster feeding on day of circ.  Feed on demand with cues.  Goal 8-12+ times per day after first 24 hrs.  Place baby STS if not cueing.  Reviewed engorgement care and monitoring voids/stools. Mother denies questions or concerns and states breastfeeding is going well.  Feeding Feeding Type: Breast Milk   Interventions Interventions: Breast feeding basics reviewed   Consult Status Consult Status: Complete Date: 11/22/20   Dahlia Byes [facility] 11/22/2020, 10:37 AM

## 2020-11-22 NOTE — Discharge Summary (Signed)
Postpartum Discharge Summary     Patient Name: Stacy Myers DOB: 03-06-1996 MRN: [id number]  Date of admission: 11/21/2020 Delivery date:11/21/2020  Delivering provider: Louretta Shorten  Date of discharge: 11/22/2020  Admitting diagnosis: Encounter for induction of labor [Z34.90] Intrauterine pregnancy: [redacted]w[redacted]d    Secondary diagnosis:  Active Problems:   Encounter for induction of labor  Additional problems: TXA and methergine    Discharge diagnosis: Term Pregnancy Delivered                                              Post partum procedures:none Augmentation: AROM and Pitocin Complications: None  Hospital course: Induction of Labor With Vaginal Delivery   25y.o. yo G2P2002 at 353w1das admitted to the hospital 11/21/2020 for induction of labor.  Indication for induction: Favorable cervix at term.  Patient had an uncomplicated labor course as follows: Membrane Rupture Time/Date: 9:02 AM ,11/21/2020   Delivery Method:Vaginal, Spontaneous  Episiotomy: None  Lacerations:  1st degree  Details of delivery can be found in separate delivery note.  Patient had a routine postpartum course. Patient is discharged home 11/22/20.  Newborn Data: Birth date:11/21/2020  Birth time:11:29 AM  Gender:Female  Living status:Living  Apgars:9 ,9  [id number]   Magnesium Sulfate received: No BMZ received: No Rhophylac:N/A MMR:N/A T-DaP:Given prenatally Flu: N/A Transfusion:No  Physical exam  Vitals:   11/21/20 1815 11/21/20 2214 11/22/20 0212 11/22/20 0350  BP: 132/67 126/69 124/71 128/78  Pulse: 65 70 64 73  Resp: 18     Temp: 98 F (36.7 C) 98 F (36.7 C) 97.9 F (36.6 C)   TempSrc:  Oral Oral   SpO2: 98% 99% 99%   Weight:      Height:       General: alert Lochia: appropriate Uterine Fundus: firm Incision: N/A DVT Evaluation: No evidence of DVT seen on physical exam. Labs: Lab Results  Component Value Date   WBC 10.3 11/22/2020   HGB 10.8 (L) 11/22/2020   HCT  32.9 (L) 11/22/2020   MCV 90.9 11/22/2020   PLT 187 11/22/2020   CMP Latest Ref Rng & Units 07/03/2016  Glucose 65 - 99 mg/dL 78  BUN 7 - 20 mg/dL 9  Creatinine 0.50 - 1.00 mg/dL 0.61  Sodium 135 - 146 mmol/L 135  Potassium 3.8 - 5.1 mmol/L 4.7  Chloride 98 - 110 mmol/L 103  CO2 20 - 31 mmol/L 27  Calcium 8.9 - 10.4 mg/dL 9.0  Total Protein 6.3 - 8.2 g/dL 6.6  Total Bilirubin 0.2 - 1.1 mg/dL 0.5  Alkaline Phos 47 - 176 U/L 55  AST 12 - 32 U/L 13  ALT 5 - 32 U/L 7   Edinburgh Score: Edinburgh Postnatal Depression Scale Screening Tool 11/22/2020  I have been able to laugh and see the funny side of things. 0  I have looked forward with enjoyment to things. 0  I have blamed myself unnecessarily when things went wrong. 0  I have been anxious or worried for no good reason. 0  I have felt scared or panicky for no good reason. 0  Things have been getting on top of me. 0  I have been so unhappy that I have had difficulty sleeping. 0  I have felt sad or miserable. 0  I have been so unhappy that I have been crying. 0  The thought of harming myself has occurred to me. 0  Edinburgh Postnatal Depression Scale Total 0      After visit meds:  Allergies as of 11/22/2020   No Known Allergies     Medication List    STOP taking these medications   aspirin EC 81 MG tablet   ondansetron 8 MG disintegrating tablet Commonly known as: ZOFRAN-ODT     TAKE these medications   ferrous sulfate 325 (65 FE) MG tablet Take 325 mg by mouth daily with breakfast.   multivitamin-prenatal 27-0.8 MG Tabs tablet Take 1 tablet by mouth daily at 12 noon.        Discharge home in stable condition Infant Feeding: Breast Infant Disposition:home with mother Discharge instruction: per After Visit Summary and Postpartum booklet. Activity: Advance as tolerated. Pelvic rest for 6 weeks.  Diet: routine diet Anticipated Birth Control: Unsure Postpartum Appointment:6 weeks Additional Postpartum F/U:  n/a Future Appointments:No future appointments. Follow up Visit:      11/22/2020 Tyson Dense, MD

## 2020-11-22 NOTE — Anesthesia Postprocedure Evaluation (Signed)
Anesthesia Post Note  Patient: Stacy Myers [facility]  Procedure(s) Performed: AN AD HOC LABOR EPIDURAL     Patient location during evaluation: Mother Baby Anesthesia Type: Epidural Level of consciousness: awake and alert, oriented and patient cooperative Pain management: pain level controlled Vital Signs Assessment: post-procedure vital signs reviewed and stable Respiratory status: spontaneous breathing Cardiovascular status: stable Postop Assessment: no headache, epidural receding, patient able to bend at knees and no signs of nausea or vomiting Anesthetic complications: no Comments: Pt. States she is walking.  Pain score 0.    No complications documented.  Last Vitals:  Vitals:   11/22/20 0212 11/22/20 0350  BP: 124/71 128/78  Pulse: 64 73  Resp:    Temp: 36.6 C   SpO2: 99%     Last Pain:  Vitals:   11/22/20 0655  TempSrc:   PainSc: 0-No pain   Pain Goal: Patients Stated Pain Goal: 3 (11/21/20 1400)              Epidural/Spinal Function Cutaneous sensation: Normal sensation (11/22/20 0655), Patient able to flex knees: Yes (11/22/20 0655), Patient able to lift hips off bed: Yes (11/22/20 0655), Back pain beyond tenderness at insertion site: No (11/22/20 0655), Progressively worsening motor and/or sensory loss: No (11/22/20 0655), Bowel and/or bladder incontinence post epidural: No (11/22/20 0655)  Merrilyn Puma

## 2020-11-22 NOTE — Progress Notes (Signed)
Patient was firm with massage at 2nd four hour check with trickle that stopped after 1 minute of massage. Patient was checked again in two hours and did not trickle. During 3rd four hour check, patient was firm but continuously trickled. Dr. Rana Snare notified and verbal orders for TXA 1,000mg  600 mL/hr IV given and Methergine 0.2mg  orally 3 times daily. Will administer medications and monitor patients bleeding.

## 2020-11-22 NOTE — Progress Notes (Addendum)
Post Partum Day 1 Subjective: no complaints, up ad lib, voiding, tolerating PO and + flatus  Scant bleeding.   Objective: Blood pressure 128/78, pulse 73, temperature 97.9 F (36.6 C), temperature source Oral, resp. rate 18, height 5\' 4"  (1.626 m), weight 88.3 kg, SpO2 99 %, unknown if currently breastfeeding.  Physical Exam:  General: alert Lochia: appropriate Uterine Fundus: firm Incision: healing well DVT Evaluation: No evidence of DVT seen on physical exam.  Recent Labs    11/21/20 0011 11/22/20 0642  HGB 11.5* 10.8*  HCT 33.0* 32.9*    Assessment/Plan: Circumcision prior to discharge  S/p TXA and methergine. NO further uterotonic needed. Hgb appropriate.  Discharge later today as long as bleeding continues to be appropriate. FF and scant bleeding on pad last changed hours ago. D/W patient female infant circumcision, risks/benefits reviewed. All questions answered.    LOS: 1 day   11/24/20 11/22/2020, 8:49 AM

## 2022-04-14 LAB — OB RESULTS CONSOLE ABO/RH: RH Type: POSITIVE

## 2022-04-14 LAB — OB RESULTS CONSOLE ANTIBODY SCREEN: Antibody Screen: NEGATIVE

## 2022-04-14 LAB — OB RESULTS CONSOLE RUBELLA ANTIBODY, IGM: Rubella: IMMUNE

## 2022-04-14 LAB — OB RESULTS CONSOLE HIV ANTIBODY (ROUTINE TESTING): HIV: NONREACTIVE

## 2022-04-14 LAB — OB RESULTS CONSOLE HEPATITIS B SURFACE ANTIGEN: Hepatitis B Surface Ag: NEGATIVE

## 2022-04-14 LAB — HEPATITIS C ANTIBODY: HCV Ab: NEGATIVE

## 2022-04-14 LAB — OB RESULTS CONSOLE RPR: RPR: NONREACTIVE

## 2022-04-27 LAB — OB RESULTS CONSOLE GC/CHLAMYDIA
Chlamydia: NEGATIVE
Neisseria Gonorrhea: NEGATIVE

## 2022-05-25 ENCOUNTER — Other Ambulatory Visit: Payer: Self-pay | Admitting: Obstetrics and Gynecology

## 2022-05-25 ENCOUNTER — Telehealth: Payer: Self-pay

## 2022-05-25 DIAGNOSIS — Z3689 Encounter for other specified antenatal screening: Secondary | ICD-10-CM

## 2022-05-27 ENCOUNTER — Other Ambulatory Visit: Payer: Self-pay

## 2022-06-15 ENCOUNTER — Encounter: Payer: Self-pay | Admitting: *Deleted

## 2022-06-19 ENCOUNTER — Other Ambulatory Visit: Payer: Self-pay | Admitting: *Deleted

## 2022-06-19 ENCOUNTER — Ambulatory Visit: Payer: BC Managed Care – PPO | Admitting: *Deleted

## 2022-06-19 ENCOUNTER — Ambulatory Visit: Payer: BC Managed Care – PPO | Attending: Obstetrics and Gynecology | Admitting: Obstetrics and Gynecology

## 2022-06-19 ENCOUNTER — Encounter: Payer: Self-pay | Admitting: *Deleted

## 2022-06-19 ENCOUNTER — Ambulatory Visit: Payer: BC Managed Care – PPO | Attending: Obstetrics and Gynecology

## 2022-06-19 VITALS — BP 121/70 | HR 77

## 2022-06-19 DIAGNOSIS — O30032 Twin pregnancy, monochorionic/diamniotic, second trimester: Secondary | ICD-10-CM | POA: Insufficient documentation

## 2022-06-19 DIAGNOSIS — Z8759 Personal history of other complications of pregnancy, childbirth and the puerperium: Secondary | ICD-10-CM

## 2022-06-19 DIAGNOSIS — Z362 Encounter for other antenatal screening follow-up: Secondary | ICD-10-CM

## 2022-06-19 DIAGNOSIS — Z148 Genetic carrier of other disease: Secondary | ICD-10-CM | POA: Diagnosis not present

## 2022-06-19 DIAGNOSIS — O30039 Twin pregnancy, monochorionic/diamniotic, unspecified trimester: Secondary | ICD-10-CM

## 2022-06-19 DIAGNOSIS — Z3A18 18 weeks gestation of pregnancy: Secondary | ICD-10-CM | POA: Insufficient documentation

## 2022-06-19 DIAGNOSIS — Z363 Encounter for antenatal screening for malformations: Secondary | ICD-10-CM | POA: Diagnosis not present

## 2022-06-19 DIAGNOSIS — Z6833 Body mass index (BMI) 33.0-33.9, adult: Secondary | ICD-10-CM

## 2022-06-19 DIAGNOSIS — Z3689 Encounter for other specified antenatal screening: Secondary | ICD-10-CM | POA: Diagnosis present

## 2022-06-19 DIAGNOSIS — O09299 Supervision of pregnancy with other poor reproductive or obstetric history, unspecified trimester: Secondary | ICD-10-CM | POA: Diagnosis not present

## 2022-06-19 DIAGNOSIS — O99212 Obesity complicating pregnancy, second trimester: Secondary | ICD-10-CM

## 2022-06-19 NOTE — Progress Notes (Signed)
Maternal-Fetal Medicine   Name: Stacy Myers DOB: 07-20-1996 MRN: [id number] Referring Provider: Candice Camp, MD  I had the pleasure of seeing [facility]. She is G3 P2 at 18w 1d gestation with twin pregnancy and is here for fetal anatomy scan. Monochorionic-diamniotic twin pregnancy was established at first trimester ultrasound. On cell-free fetal DNA screening, the risks of fetal aneuploidies are not increased.  MSAFP screening showed low risk for open neural tube defects.  Obstetric history significant for 2 term vaginal deliveries (2015 and 2022).  Her most recent pregnancy was complicated by gestational hypertension.  Both her children are in good health. Patient reports no chronic medical conditions including diabetes or hypertension. 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  Ultrasound We performed a fetal anatomical survey.  Monochorionic-diamniotic twin pregnancy.  Twin A: Lower fetus, maternal left, breech presentation, posterior placenta, female fetus.  Fetal biometry is consistent with the previously established dates.  Amniotic fluid is normal and good fetal activity seen.  An echogenic intracardiac focus is seen.  No other markers of aneuploidies or fetal structural defects are seen.  Twin B: Upper fetus, maternal right, breech presentation, posterior placenta, female fetus. Fetal biometry is consistent with the previously established dates.  Amniotic fluid is normal and good fetal activity seen.  An echogenic intracardiac focus is seen.  No other markers of aneuploidies or fetal structural defects are seen.  Growth discordancy: 6% (normal).  Both kidneys, genitalia and extremities appear normal (Bardet-Biedl syndrome is very unlikely).  Monochorionic-diamniotic twin pregnancy: -Explained chorionicity and its implications.  -Monochorionic twins have a higher rate of complications  including congenital malformations, twin to twin transfusion syndrome(TTTS) (15%), selective growth restriction, and fetal demise of one or both twins.  -Twin pregnancies are associated with increased likelihood of gestational diabetes, gestational hypertension, or preeclampsia, malpresentations, cesarean delivery and postpartum hemorrhage.  -Preterm delivery is the most-common complication of twin pregnancies.   -I discussed ultrasound surveillance for TTTS every 2 weeks from now till delivery. -I discussed timing and mode of delivery. We recommend delivery at 37 weeks in monochorionic twins to prevent the likelihood of stillbirth of one or both twins that is increased in monochorionic twins. Earlier delivery may be indicated if this pregnancy is complicated by fetal growth restriction or other maternal complications.  -In vertex/vertex presentations, vaginal delivery can be safely undertaken. In non-vertex presentation of twin A, we recommend cesarean delivery. If first twin is vertex, and the second twin is non-vertex, either cesarean delivery or vaginal delivery of first twin followed by internal podalic version of second twin may be performed (depends on obstetrician's experience and patient's preference).  Low-dose aspirin prophylaxis is beneficial in preventing preeclampsia.  Echogenic intracardiac focus I explained the finding of echogenic intracardiac foci in both twins.  She has low risk for fetal Down syndrome on cell-free fetal DNA screening, this finding should be considered a normal variant.  I did not recommend amniocentesis to confirm fetal Down syndrome.  Recommendations -Follow-up scan in 2 weeks. -TTTS surveillance ultrasound every 2 weeks. -Fetal growth assessment every 4 weeks. -Weekly antenatal testing from 32 weeks. -Recommend delivery at 37 weeks. -Low-dose aspirin prophylaxis.  Thank you for consultation.  If you have any questions or concerns, please contact me the  [facility].  Consultation including face-to-face (more than 50%) counseling 30 minutes.

## 2022-07-03 ENCOUNTER — Encounter: Payer: Self-pay | Admitting: *Deleted

## 2022-07-03 ENCOUNTER — Ambulatory Visit: Payer: BC Managed Care – PPO | Admitting: *Deleted

## 2022-07-03 ENCOUNTER — Ambulatory Visit: Payer: BC Managed Care – PPO | Attending: Obstetrics and Gynecology

## 2022-07-03 DIAGNOSIS — Z362 Encounter for other antenatal screening follow-up: Secondary | ICD-10-CM | POA: Diagnosis present

## 2022-07-03 DIAGNOSIS — O09292 Supervision of pregnancy with other poor reproductive or obstetric history, second trimester: Secondary | ICD-10-CM | POA: Diagnosis not present

## 2022-07-03 DIAGNOSIS — O99212 Obesity complicating pregnancy, second trimester: Secondary | ICD-10-CM | POA: Insufficient documentation

## 2022-07-03 DIAGNOSIS — Z6833 Body mass index (BMI) 33.0-33.9, adult: Secondary | ICD-10-CM | POA: Diagnosis present

## 2022-07-03 DIAGNOSIS — O30039 Twin pregnancy, monochorionic/diamniotic, unspecified trimester: Secondary | ICD-10-CM | POA: Diagnosis present

## 2022-07-03 DIAGNOSIS — E669 Obesity, unspecified: Secondary | ICD-10-CM

## 2022-07-03 DIAGNOSIS — Z3A20 20 weeks gestation of pregnancy: Secondary | ICD-10-CM | POA: Diagnosis not present

## 2022-07-03 DIAGNOSIS — Z8759 Personal history of other complications of pregnancy, childbirth and the puerperium: Secondary | ICD-10-CM

## 2022-07-03 DIAGNOSIS — O30032 Twin pregnancy, monochorionic/diamniotic, second trimester: Secondary | ICD-10-CM | POA: Diagnosis not present

## 2022-07-03 DIAGNOSIS — O285 Abnormal chromosomal and genetic finding on antenatal screening of mother: Secondary | ICD-10-CM

## 2022-07-03 DIAGNOSIS — Z148 Genetic carrier of other disease: Secondary | ICD-10-CM

## 2022-07-10 ENCOUNTER — Inpatient Hospital Stay (HOSPITAL_COMMUNITY): Admit: 2022-07-10 | Payer: BC Managed Care – PPO | Admitting: Obstetrics and Gynecology

## 2022-07-17 ENCOUNTER — Ambulatory Visit: Payer: BC Managed Care – PPO | Admitting: *Deleted

## 2022-07-17 ENCOUNTER — Ambulatory Visit: Payer: BC Managed Care – PPO | Attending: Obstetrics and Gynecology

## 2022-07-17 ENCOUNTER — Other Ambulatory Visit: Payer: Self-pay | Admitting: *Deleted

## 2022-07-17 VITALS — BP 123/71 | HR 68

## 2022-07-17 DIAGNOSIS — Z6833 Body mass index (BMI) 33.0-33.9, adult: Secondary | ICD-10-CM

## 2022-07-17 DIAGNOSIS — O30032 Twin pregnancy, monochorionic/diamniotic, second trimester: Secondary | ICD-10-CM

## 2022-07-17 DIAGNOSIS — Z8759 Personal history of other complications of pregnancy, childbirth and the puerperium: Secondary | ICD-10-CM | POA: Insufficient documentation

## 2022-07-17 DIAGNOSIS — O285 Abnormal chromosomal and genetic finding on antenatal screening of mother: Secondary | ICD-10-CM | POA: Diagnosis not present

## 2022-07-17 DIAGNOSIS — O99212 Obesity complicating pregnancy, second trimester: Secondary | ICD-10-CM

## 2022-07-17 DIAGNOSIS — O30039 Twin pregnancy, monochorionic/diamniotic, unspecified trimester: Secondary | ICD-10-CM

## 2022-07-17 DIAGNOSIS — O09292 Supervision of pregnancy with other poor reproductive or obstetric history, second trimester: Secondary | ICD-10-CM | POA: Diagnosis not present

## 2022-07-17 DIAGNOSIS — E669 Obesity, unspecified: Secondary | ICD-10-CM

## 2022-07-17 DIAGNOSIS — O09299 Supervision of pregnancy with other poor reproductive or obstetric history, unspecified trimester: Secondary | ICD-10-CM

## 2022-07-17 DIAGNOSIS — Z148 Genetic carrier of other disease: Secondary | ICD-10-CM

## 2022-07-17 DIAGNOSIS — Z362 Encounter for other antenatal screening follow-up: Secondary | ICD-10-CM

## 2022-07-17 DIAGNOSIS — Z3A22 22 weeks gestation of pregnancy: Secondary | ICD-10-CM

## 2022-07-31 ENCOUNTER — Ambulatory Visit: Payer: BC Managed Care – PPO | Attending: Obstetrics and Gynecology

## 2022-07-31 ENCOUNTER — Ambulatory Visit: Payer: BC Managed Care – PPO | Admitting: *Deleted

## 2022-07-31 VITALS — BP 123/71 | HR 78

## 2022-07-31 DIAGNOSIS — Z362 Encounter for other antenatal screening follow-up: Secondary | ICD-10-CM | POA: Diagnosis present

## 2022-07-31 DIAGNOSIS — O30039 Twin pregnancy, monochorionic/diamniotic, unspecified trimester: Secondary | ICD-10-CM

## 2022-07-31 DIAGNOSIS — O30042 Twin pregnancy, dichorionic/diamniotic, second trimester: Secondary | ICD-10-CM | POA: Diagnosis not present

## 2022-07-31 DIAGNOSIS — Z6833 Body mass index (BMI) 33.0-33.9, adult: Secondary | ICD-10-CM | POA: Insufficient documentation

## 2022-07-31 DIAGNOSIS — O285 Abnormal chromosomal and genetic finding on antenatal screening of mother: Secondary | ICD-10-CM

## 2022-07-31 DIAGNOSIS — Z8759 Personal history of other complications of pregnancy, childbirth and the puerperium: Secondary | ICD-10-CM | POA: Diagnosis present

## 2022-07-31 DIAGNOSIS — O09292 Supervision of pregnancy with other poor reproductive or obstetric history, second trimester: Secondary | ICD-10-CM | POA: Diagnosis not present

## 2022-07-31 DIAGNOSIS — Z3A24 24 weeks gestation of pregnancy: Secondary | ICD-10-CM

## 2022-07-31 DIAGNOSIS — Z148 Genetic carrier of other disease: Secondary | ICD-10-CM | POA: Diagnosis not present

## 2022-07-31 DIAGNOSIS — O99212 Obesity complicating pregnancy, second trimester: Secondary | ICD-10-CM | POA: Diagnosis not present

## 2022-07-31 DIAGNOSIS — E669 Obesity, unspecified: Secondary | ICD-10-CM

## 2022-08-14 ENCOUNTER — Encounter: Payer: Self-pay | Admitting: *Deleted

## 2022-08-14 ENCOUNTER — Ambulatory Visit: Payer: BC Managed Care – PPO | Admitting: *Deleted

## 2022-08-14 ENCOUNTER — Other Ambulatory Visit: Payer: Self-pay | Admitting: *Deleted

## 2022-08-14 ENCOUNTER — Ambulatory Visit: Payer: BC Managed Care – PPO | Attending: Obstetrics and Gynecology

## 2022-08-14 DIAGNOSIS — Z362 Encounter for other antenatal screening follow-up: Secondary | ICD-10-CM

## 2022-08-14 DIAGNOSIS — O30032 Twin pregnancy, monochorionic/diamniotic, second trimester: Secondary | ICD-10-CM

## 2022-08-14 DIAGNOSIS — Z6833 Body mass index (BMI) 33.0-33.9, adult: Secondary | ICD-10-CM

## 2022-08-14 DIAGNOSIS — O1492 Unspecified pre-eclampsia, second trimester: Secondary | ICD-10-CM | POA: Diagnosis not present

## 2022-08-14 DIAGNOSIS — E669 Obesity, unspecified: Secondary | ICD-10-CM

## 2022-08-14 DIAGNOSIS — O09292 Supervision of pregnancy with other poor reproductive or obstetric history, second trimester: Secondary | ICD-10-CM

## 2022-08-14 DIAGNOSIS — O321XX2 Maternal care for breech presentation, fetus 2: Secondary | ICD-10-CM | POA: Insufficient documentation

## 2022-08-14 DIAGNOSIS — O99212 Obesity complicating pregnancy, second trimester: Secondary | ICD-10-CM | POA: Insufficient documentation

## 2022-08-14 DIAGNOSIS — Z3A26 26 weeks gestation of pregnancy: Secondary | ICD-10-CM | POA: Diagnosis not present

## 2022-08-14 DIAGNOSIS — O30033 Twin pregnancy, monochorionic/diamniotic, third trimester: Secondary | ICD-10-CM

## 2022-08-14 DIAGNOSIS — O09293 Supervision of pregnancy with other poor reproductive or obstetric history, third trimester: Secondary | ICD-10-CM

## 2022-08-14 DIAGNOSIS — O30039 Twin pregnancy, monochorionic/diamniotic, unspecified trimester: Secondary | ICD-10-CM | POA: Diagnosis present

## 2022-08-14 DIAGNOSIS — Z8759 Personal history of other complications of pregnancy, childbirth and the puerperium: Secondary | ICD-10-CM

## 2022-08-28 ENCOUNTER — Ambulatory Visit: Payer: BC Managed Care – PPO | Admitting: *Deleted

## 2022-08-28 ENCOUNTER — Ambulatory Visit: Payer: BC Managed Care – PPO | Attending: Obstetrics and Gynecology

## 2022-08-28 VITALS — BP 126/68 | HR 79

## 2022-08-28 DIAGNOSIS — Z148 Genetic carrier of other disease: Secondary | ICD-10-CM | POA: Diagnosis not present

## 2022-08-28 DIAGNOSIS — O09299 Supervision of pregnancy with other poor reproductive or obstetric history, unspecified trimester: Secondary | ICD-10-CM | POA: Diagnosis present

## 2022-08-28 DIAGNOSIS — O285 Abnormal chromosomal and genetic finding on antenatal screening of mother: Secondary | ICD-10-CM | POA: Diagnosis not present

## 2022-08-28 DIAGNOSIS — O30032 Twin pregnancy, monochorionic/diamniotic, second trimester: Secondary | ICD-10-CM | POA: Insufficient documentation

## 2022-08-28 DIAGNOSIS — O09293 Supervision of pregnancy with other poor reproductive or obstetric history, third trimester: Secondary | ICD-10-CM | POA: Diagnosis not present

## 2022-08-28 DIAGNOSIS — Z3A28 28 weeks gestation of pregnancy: Secondary | ICD-10-CM

## 2022-09-11 ENCOUNTER — Ambulatory Visit: Payer: BC Managed Care – PPO | Attending: Obstetrics and Gynecology

## 2022-09-11 ENCOUNTER — Ambulatory Visit: Payer: BC Managed Care – PPO | Admitting: *Deleted

## 2022-09-11 VITALS — BP 127/76 | HR 66

## 2022-09-11 DIAGNOSIS — O30033 Twin pregnancy, monochorionic/diamniotic, third trimester: Secondary | ICD-10-CM | POA: Diagnosis not present

## 2022-09-11 DIAGNOSIS — Z148 Genetic carrier of other disease: Secondary | ICD-10-CM | POA: Diagnosis not present

## 2022-09-11 DIAGNOSIS — Z3A30 30 weeks gestation of pregnancy: Secondary | ICD-10-CM

## 2022-09-11 DIAGNOSIS — O09293 Supervision of pregnancy with other poor reproductive or obstetric history, third trimester: Secondary | ICD-10-CM

## 2022-09-11 DIAGNOSIS — O30032 Twin pregnancy, monochorionic/diamniotic, second trimester: Secondary | ICD-10-CM | POA: Diagnosis present

## 2022-09-11 DIAGNOSIS — O09299 Supervision of pregnancy with other poor reproductive or obstetric history, unspecified trimester: Secondary | ICD-10-CM

## 2022-09-18 ENCOUNTER — Other Ambulatory Visit: Payer: Self-pay | Admitting: Obstetrics and Gynecology

## 2022-09-18 ENCOUNTER — Ambulatory Visit: Payer: BC Managed Care – PPO | Admitting: *Deleted

## 2022-09-18 ENCOUNTER — Other Ambulatory Visit: Payer: Self-pay | Admitting: *Deleted

## 2022-09-18 ENCOUNTER — Ambulatory Visit: Payer: BC Managed Care – PPO | Attending: Obstetrics and Gynecology

## 2022-09-18 VITALS — BP 135/76 | HR 86

## 2022-09-18 DIAGNOSIS — O09299 Supervision of pregnancy with other poor reproductive or obstetric history, unspecified trimester: Secondary | ICD-10-CM | POA: Insufficient documentation

## 2022-09-18 DIAGNOSIS — O09293 Supervision of pregnancy with other poor reproductive or obstetric history, third trimester: Secondary | ICD-10-CM

## 2022-09-18 DIAGNOSIS — Z3A31 31 weeks gestation of pregnancy: Secondary | ICD-10-CM

## 2022-09-18 DIAGNOSIS — O30033 Twin pregnancy, monochorionic/diamniotic, third trimester: Secondary | ICD-10-CM

## 2022-09-18 DIAGNOSIS — Z148 Genetic carrier of other disease: Secondary | ICD-10-CM | POA: Diagnosis not present

## 2022-09-18 DIAGNOSIS — O99891 Other specified diseases and conditions complicating pregnancy: Secondary | ICD-10-CM | POA: Diagnosis not present

## 2022-09-23 ENCOUNTER — Ambulatory Visit: Payer: BC Managed Care – PPO | Admitting: *Deleted

## 2022-09-23 ENCOUNTER — Ambulatory Visit: Payer: BC Managed Care – PPO | Attending: Obstetrics and Gynecology

## 2022-09-23 VITALS — BP 135/72 | HR 75

## 2022-09-23 DIAGNOSIS — O09299 Supervision of pregnancy with other poor reproductive or obstetric history, unspecified trimester: Secondary | ICD-10-CM | POA: Insufficient documentation

## 2022-09-23 DIAGNOSIS — O09293 Supervision of pregnancy with other poor reproductive or obstetric history, third trimester: Secondary | ICD-10-CM | POA: Diagnosis not present

## 2022-09-23 DIAGNOSIS — Z3A31 31 weeks gestation of pregnancy: Secondary | ICD-10-CM

## 2022-09-23 DIAGNOSIS — Z148 Genetic carrier of other disease: Secondary | ICD-10-CM

## 2022-09-23 DIAGNOSIS — O30033 Twin pregnancy, monochorionic/diamniotic, third trimester: Secondary | ICD-10-CM | POA: Diagnosis not present

## 2022-09-23 DIAGNOSIS — O99891 Other specified diseases and conditions complicating pregnancy: Secondary | ICD-10-CM | POA: Diagnosis not present

## 2022-10-02 ENCOUNTER — Other Ambulatory Visit: Payer: Self-pay | Admitting: Obstetrics and Gynecology

## 2022-10-02 ENCOUNTER — Ambulatory Visit: Payer: BC Managed Care – PPO | Attending: Obstetrics and Gynecology

## 2022-10-02 ENCOUNTER — Encounter: Payer: Self-pay | Admitting: Obstetrics and Gynecology

## 2022-10-02 ENCOUNTER — Ambulatory Visit: Payer: BC Managed Care – PPO | Admitting: *Deleted

## 2022-10-02 VITALS — BP 133/78 | HR 79

## 2022-10-02 DIAGNOSIS — O09293 Supervision of pregnancy with other poor reproductive or obstetric history, third trimester: Secondary | ICD-10-CM

## 2022-10-02 DIAGNOSIS — O30033 Twin pregnancy, monochorionic/diamniotic, third trimester: Secondary | ICD-10-CM | POA: Diagnosis present

## 2022-10-02 DIAGNOSIS — O365930 Maternal care for other known or suspected poor fetal growth, third trimester, not applicable or unspecified: Secondary | ICD-10-CM | POA: Diagnosis not present

## 2022-10-02 DIAGNOSIS — Z148 Genetic carrier of other disease: Secondary | ICD-10-CM

## 2022-10-02 DIAGNOSIS — O09299 Supervision of pregnancy with other poor reproductive or obstetric history, unspecified trimester: Secondary | ICD-10-CM | POA: Diagnosis present

## 2022-10-02 DIAGNOSIS — O99891 Other specified diseases and conditions complicating pregnancy: Secondary | ICD-10-CM

## 2022-10-02 DIAGNOSIS — Z3A33 33 weeks gestation of pregnancy: Secondary | ICD-10-CM

## 2022-10-05 ENCOUNTER — Other Ambulatory Visit: Payer: Self-pay | Admitting: *Deleted

## 2022-10-05 DIAGNOSIS — O365931 Maternal care for other known or suspected poor fetal growth, third trimester, fetus 1: Secondary | ICD-10-CM

## 2022-10-05 DIAGNOSIS — O30009 Twin pregnancy, unspecified number of placenta and unspecified number of amniotic sacs, unspecified trimester: Secondary | ICD-10-CM

## 2022-10-08 ENCOUNTER — Encounter (HOSPITAL_COMMUNITY): Payer: Self-pay

## 2022-10-08 ENCOUNTER — Ambulatory Visit (HOSPITAL_BASED_OUTPATIENT_CLINIC_OR_DEPARTMENT_OTHER): Payer: BC Managed Care – PPO | Admitting: Maternal & Fetal Medicine

## 2022-10-08 ENCOUNTER — Ambulatory Visit: Payer: BC Managed Care – PPO | Admitting: *Deleted

## 2022-10-08 ENCOUNTER — Ambulatory Visit: Payer: BC Managed Care – PPO | Attending: Obstetrics and Gynecology

## 2022-10-08 VITALS — BP 135/78 | HR 70

## 2022-10-08 DIAGNOSIS — O09293 Supervision of pregnancy with other poor reproductive or obstetric history, third trimester: Secondary | ICD-10-CM | POA: Diagnosis not present

## 2022-10-08 DIAGNOSIS — O365930 Maternal care for other known or suspected poor fetal growth, third trimester, not applicable or unspecified: Secondary | ICD-10-CM | POA: Diagnosis not present

## 2022-10-08 DIAGNOSIS — O30033 Twin pregnancy, monochorionic/diamniotic, third trimester: Secondary | ICD-10-CM

## 2022-10-08 DIAGNOSIS — O99891 Other specified diseases and conditions complicating pregnancy: Secondary | ICD-10-CM

## 2022-10-08 DIAGNOSIS — Z3A34 34 weeks gestation of pregnancy: Secondary | ICD-10-CM | POA: Diagnosis not present

## 2022-10-08 DIAGNOSIS — Z148 Genetic carrier of other disease: Secondary | ICD-10-CM | POA: Diagnosis not present

## 2022-10-08 DIAGNOSIS — O30009 Twin pregnancy, unspecified number of placenta and unspecified number of amniotic sacs, unspecified trimester: Secondary | ICD-10-CM | POA: Diagnosis present

## 2022-10-08 DIAGNOSIS — O09299 Supervision of pregnancy with other poor reproductive or obstetric history, unspecified trimester: Secondary | ICD-10-CM | POA: Diagnosis present

## 2022-10-08 DIAGNOSIS — O365931 Maternal care for other known or suspected poor fetal growth, third trimester, fetus 1: Secondary | ICD-10-CM | POA: Diagnosis not present

## 2022-10-08 DIAGNOSIS — O133 Gestational [pregnancy-induced] hypertension without significant proteinuria, third trimester: Secondary | ICD-10-CM

## 2022-10-08 NOTE — Patient Instructions (Signed)
Stacy Myers [facility]  10/08/2022   Your procedure is scheduled on:  10/12/2022  Arrive at 0800 at [employer] [facility] on [address] at [facility]  and [employer]. You are invited to use the FREE valet parking or use the Visitor's parking deck.  Pick up the phone at the desk and dial [phone].  Call this number if you have problems the morning of surgery: [phone]  Remember:   Do not eat food:(After Midnight) Desps de medianoche.  Do not drink clear liquids: (After Midnight) Desps de medianoche.  Take these medicines the morning of surgery with A SIP OF WATER:  none   Do not wear jewelry, make-up or nail polish.  Do not wear lotions, powders, or perfumes. Do not wear deodorant.  Do not shave 48 hours prior to surgery.  Do not bring valuables to the hospital.  [facility] is not   responsible for any belongings or valuables brought to the hospital.  Contacts, dentures or bridgework may not be worn into surgery.  Leave suitcase in the car. After surgery it may be brought to your room.  For patients admitted to the hospital, checkout time is 11:00 AM the day of              discharge.      Please read over the following fact sheets that you were given:     Preparing for Surgery

## 2022-10-08 NOTE — Progress Notes (Signed)
MFM Consult Note Patient Name: Stacy Myers  Patient MRN:   [id number]  Referring provider: Dr. Rana Snare  Reason for Consult: Mo-di twin with FGR of one twin   HPI: Stacy Myers is a 26 y.o. G3P2002 at [redacted]w[redacted]d here for ultrasound and consultation.   She is here for a BPP for mo-di twins.  She is worried about her blood pressure. At home her BP is consistently in the 140s over 70s to 90s. She has had headches but not today. She denies vision changes, RUQ or LE swelling.We discussed delivery timing of a mo-di twin pregnancy with selective FGR ranges from 32-35 weeks. She is more comfortable with an earlier delivery. In order to minimize neonatal morbidity from prematurity while minimizing the stillbirth risk, IOL by 35 weeks is recommended.   Sonographic findings Di-di intrauterine pregnancy at [redacted]w[redacted]d   Twin A Cephalic presentaton. Normal interval anatomy with stomach and bladder seen.  Normal amniotic fluid. Placenta is posterior.  BPP 8/8   Twin B Cephalic presentaton. Normal interval anatomy with stomach and bladder seen.  Normal amniotic fluid.  Placenta is posterior. BPP 8/8   There are no signs of TTTS on today's ultrasound.   Review of Systems: A review of systems was performed and was negative except per HPI   Vitals and Physical Exam See intake sheet for vitals Sitting comfortably on the sonogram table Nonlabored breathing Normal rate and rhythm Abdomen is nontender  Recommendations - In order to minimize neonatal morbidity from prematurity while minimizing the stillbirth risk, IOL by 35 weeks is recommended.  - I also instructed her to present to the hospital for delivery if her blood pressure is elevated and she has s/s of preeclampsia.  - I recommend she have preeclampsia labs today since her BP has been elevated. If these cannot be done at her OB visit today I suggest she go to the MAU.  I spent 30 minutes reviewing the patients chart, including  labs and images as well as counseling the patient about her medical conditions.  Braxton Feathers  MFM, [facility]   10/08/2022  8:52 AM

## 2022-10-09 ENCOUNTER — Telehealth (HOSPITAL_COMMUNITY): Payer: Self-pay | Admitting: Neonatology

## 2022-10-09 ENCOUNTER — Encounter (HOSPITAL_COMMUNITY)
Admission: RE | Admit: 2022-10-09 | Discharge: 2022-10-09 | Disposition: A | Discharge status: Home / Self Care | Payer: BC Managed Care – PPO | Source: Ambulatory Visit | Attending: Obstetrics and Gynecology | Admitting: Obstetrics and Gynecology

## 2022-10-09 DIAGNOSIS — Z3A34 34 weeks gestation of pregnancy: Secondary | ICD-10-CM | POA: Insufficient documentation

## 2022-10-09 DIAGNOSIS — O133 Gestational [pregnancy-induced] hypertension without significant proteinuria, third trimester: Secondary | ICD-10-CM | POA: Insufficient documentation

## 2022-10-09 HISTORY — DX: Gestational (pregnancy-induced) hypertension without significant proteinuria, unspecified trimester: O13.9

## 2022-10-09 LAB — CBC
HCT: 34.8 % — ABNORMAL LOW (ref 36.0–46.0)
Hemoglobin: 11.8 g/dL — ABNORMAL LOW (ref 12.0–15.0)
MCH: 30.6 pg (ref 26.0–34.0)
MCHC: 33.9 g/dL (ref 30.0–36.0)
MCV: 90.4 fL (ref 80.0–100.0)
Platelets: 188 10*3/uL (ref 150–400)
RBC: 3.85 MIL/uL — ABNORMAL LOW (ref 3.87–5.11)
RDW: 13.6 % (ref 11.5–15.5)
WBC: 9.4 10*3/uL (ref 4.0–10.5)
nRBC: 0 % (ref 0.0–0.2)

## 2022-10-09 LAB — TYPE AND SCREEN
ABO/RH(D): A POS
Antibody Screen: NEGATIVE

## 2022-10-09 LAB — RPR: RPR Ser Ql: NONREACTIVE

## 2022-10-09 NOTE — Telephone Encounter (Signed)
[facility]  Prenatal Consult       10/09/2022  5:27 PM  (Telephone Consultation)  I was asked by Dr. Rana Snare to consult on this patient for anticipated preterm delivery. I had the pleasure of talking with Stacy Myers by phone today. She is a 26 year old G45P2002 woman at 45w gestation with mo-di twins. Pregnancy complicated by mono-di twin gestation with fetal growth restriction of twin A and gestational HTN. IOL is planned for Monday 12/11 due to mo-di twin gestation and hypertension (no diagnosis of pre-eclampsia at this time by her report). She requests information about what to expect with 34 week premature twins.  I explained that the neonatal intensive care team would be present for the delivery and outlined the likely delivery room course for this baby including routine resuscitation and NRP-guided approaches to the treatment of respiratory distress. We discussed other common problems associated with prematurity including respiratory distress, apnea, feeding issues, and temperature regulation.  We discussed the average length of stay but I noted that the actual LOS would depend on the severity of problems encountered and response to treatments. We discussed visitation policies and the resources available while her children are in the hospital. She is aware that one twin may be ready to discharge before the other. We discussed visitation/rooming in and resources for parents.  Stacy Myers plans to pump and bottle feed her babies, as she has done with her previous children. We discussed regular pumping and the resources available to her to support lactation. We briefly discussed infant feeding readiness and how we determine when babies are mature enough to begin bottle feeding and the usual timing of this.  Thank you for involving [place] in the care of this patient. A member of our team will be available should the family have additional questions.  Time for consultation:  approximately 20 minutes' time in discussion of the risks and medical care associated with preterm delivery.  Jacob Moores, MD Neonatal Medicine

## 2022-10-12 ENCOUNTER — Encounter (HOSPITAL_COMMUNITY)
Admission: AD | Disposition: A | Discharge status: Home / Self Care | Payer: Self-pay | Source: Home / Self Care | Attending: Obstetrics and Gynecology

## 2022-10-12 ENCOUNTER — Other Ambulatory Visit: Payer: Self-pay

## 2022-10-12 ENCOUNTER — Inpatient Hospital Stay (HOSPITAL_COMMUNITY): Payer: BC Managed Care – PPO | Admitting: Anesthesiology

## 2022-10-12 ENCOUNTER — Inpatient Hospital Stay (HOSPITAL_COMMUNITY)
Admission: AD | Admit: 2022-10-12 | Discharge: 2022-10-14 | DRG: 788 | Disposition: A | Discharge status: Home / Self Care | Payer: BC Managed Care – PPO | Attending: Obstetrics and Gynecology | Admitting: Obstetrics and Gynecology

## 2022-10-12 ENCOUNTER — Encounter (HOSPITAL_COMMUNITY): Payer: Self-pay | Admitting: Obstetrics and Gynecology

## 2022-10-12 DIAGNOSIS — O365931 Maternal care for other known or suspected poor fetal growth, third trimester, fetus 1: Principal | ICD-10-CM | POA: Diagnosis present

## 2022-10-12 DIAGNOSIS — O365930 Maternal care for other known or suspected poor fetal growth, third trimester, not applicable or unspecified: Secondary | ICD-10-CM | POA: Diagnosis present

## 2022-10-12 DIAGNOSIS — Z3A34 34 weeks gestation of pregnancy: Secondary | ICD-10-CM

## 2022-10-12 DIAGNOSIS — O30009 Twin pregnancy, unspecified number of placenta and unspecified number of amniotic sacs, unspecified trimester: Secondary | ICD-10-CM | POA: Diagnosis present

## 2022-10-12 DIAGNOSIS — O365932 Maternal care for other known or suspected poor fetal growth, third trimester, fetus 2: Secondary | ICD-10-CM | POA: Diagnosis present

## 2022-10-12 DIAGNOSIS — O1404 Mild to moderate pre-eclampsia, complicating childbirth: Secondary | ICD-10-CM

## 2022-10-12 DIAGNOSIS — Z01818 Encounter for other preprocedural examination: Secondary | ICD-10-CM

## 2022-10-12 DIAGNOSIS — O30033 Twin pregnancy, monochorionic/diamniotic, third trimester: Secondary | ICD-10-CM | POA: Diagnosis present

## 2022-10-12 DIAGNOSIS — O134 Gestational [pregnancy-induced] hypertension without significant proteinuria, complicating childbirth: Secondary | ICD-10-CM | POA: Diagnosis present

## 2022-10-12 LAB — COMPREHENSIVE METABOLIC PANEL
ALT: 15 U/L (ref 0–44)
AST: 24 U/L (ref 15–41)
Albumin: 2.7 g/dL — ABNORMAL LOW (ref 3.5–5.0)
Alkaline Phosphatase: 137 U/L — ABNORMAL HIGH (ref 38–126)
Anion gap: 8 (ref 5–15)
BUN: 9 mg/dL (ref 6–20)
CO2: 22 mmol/L (ref 22–32)
Calcium: 9 mg/dL (ref 8.9–10.3)
Chloride: 106 mmol/L (ref 98–111)
Creatinine, Ser: 0.57 mg/dL (ref 0.44–1.00)
GFR, Estimated: >60 mL/min (ref >60)
Glucose, Bld: 80 mg/dL (ref 70–99)
Potassium: 4 mmol/L (ref 3.5–5.1)
Sodium: 136 mmol/L (ref 135–145)
Total Bilirubin: 0.3 mg/dL (ref 0.3–1.2)
Total Protein: 5.4 g/dL — ABNORMAL LOW (ref 6.5–8.1)

## 2022-10-12 SURGERY — Surgical Case
Anesthesia: Spinal

## 2022-10-12 MED ORDER — DOCUSATE SODIUM 100 MG PO CAPS
100.0000 mg | ORAL_CAPSULE | Freq: Two times a day (BID) | ORAL | Status: DC
  Administered 2022-10-12 – 2022-10-14 (×4): 100 mg via ORAL
  Filled 2022-10-12 (×4): qty 1

## 2022-10-12 MED ORDER — PROMETHAZINE HCL 25 MG/ML IJ SOLN: 6.2500 mg | INTRAMUSCULAR | Status: DC | PRN

## 2022-10-12 MED ORDER — PHENYLEPHRINE HCL-NACL 20-0.9 MG/250ML-% IV SOLN
INTRAVENOUS | Status: DC | PRN
  Administered 2022-10-12: 30 ug/min via INTRAVENOUS

## 2022-10-12 MED ORDER — POVIDONE-IODINE 10 % EX SWAB
2.0000 | Freq: Once | CUTANEOUS | Status: AC
  Administered 2022-10-12: 2 via TOPICAL

## 2022-10-12 MED ORDER — CEFAZOLIN SODIUM-DEXTROSE 2-4 GM/100ML-% IV SOLN: 2.0000 g | INTRAVENOUS | Status: DC

## 2022-10-12 MED ORDER — KETOROLAC TROMETHAMINE 30 MG/ML IJ SOLN: 30.0000 mg | Freq: Four times a day (QID) | INTRAMUSCULAR | Status: AC | PRN

## 2022-10-12 MED ORDER — SCOPOLAMINE 1 MG/3DAYS TD PT72: 1.0000 | MEDICATED_PATCH | Freq: Once | TRANSDERMAL | Status: DC

## 2022-10-12 MED ORDER — SOD CITRATE-CITRIC ACID 500-334 MG/5ML PO SOLN
ORAL | Status: AC
  Filled 2022-10-12: qty 30

## 2022-10-12 MED ORDER — WITCH HAZEL-GLYCERIN EX PADS: 1.0000 | MEDICATED_PAD | CUTANEOUS | Status: DC | PRN

## 2022-10-12 MED ORDER — CEFAZOLIN SODIUM-DEXTROSE 2-4 GM/100ML-% IV SOLN
INTRAVENOUS | Status: AC
  Filled 2022-10-12: qty 100

## 2022-10-12 MED ORDER — PHENYLEPHRINE 80 MCG/ML (10ML) SYRINGE FOR IV PUSH (FOR BLOOD PRESSURE SUPPORT)
PREFILLED_SYRINGE | INTRAVENOUS | Status: AC
  Filled 2022-10-12: qty 10

## 2022-10-12 MED ORDER — MEPERIDINE HCL 25 MG/ML IJ SOLN: 6.2500 mg | INTRAMUSCULAR | Status: DC | PRN

## 2022-10-12 MED ORDER — SOD CITRATE-CITRIC ACID 500-334 MG/5ML PO SOLN
30.0000 mL | ORAL | Status: AC
  Administered 2022-10-12: 30 mL via ORAL

## 2022-10-12 MED ORDER — ACETAMINOPHEN 325 MG PO TABS
650.0000 mg | ORAL_TABLET | ORAL | Status: DC | PRN
  Administered 2022-10-12 – 2022-10-14 (×7): 650 mg via ORAL
  Filled 2022-10-12 (×7): qty 2

## 2022-10-12 MED ORDER — TRANEXAMIC ACID-NACL 1000-0.7 MG/100ML-% IV SOLN
INTRAVENOUS | Status: DC | PRN
  Administered 2022-10-12: 1000 mg via INTRAVENOUS

## 2022-10-12 MED ORDER — TRANEXAMIC ACID-NACL 1000-0.7 MG/100ML-% IV SOLN
INTRAVENOUS | Status: AC
  Filled 2022-10-12: qty 100

## 2022-10-12 MED ORDER — OXYCODONE HCL 5 MG PO TABS: 5.0000 mg | ORAL_TABLET | Freq: Once | ORAL | Status: DC | PRN

## 2022-10-12 MED ORDER — DEXAMETHASONE SODIUM PHOSPHATE 4 MG/ML IJ SOLN
INTRAMUSCULAR | Status: AC
  Filled 2022-10-12: qty 2

## 2022-10-12 MED ORDER — NALOXONE HCL 4 MG/10ML IJ SOLN: 1.0000 ug/kg/h | INTRAVENOUS | Status: DC | PRN

## 2022-10-12 MED ORDER — KETOROLAC TROMETHAMINE 30 MG/ML IJ SOLN
30.0000 mg | Freq: Four times a day (QID) | INTRAMUSCULAR | Status: AC | PRN
  Administered 2022-10-12 – 2022-10-13 (×3): 30 mg via INTRAVENOUS
  Filled 2022-10-12 (×3): qty 1

## 2022-10-12 MED ORDER — SIMETHICONE 80 MG PO CHEW
80.0000 mg | CHEWABLE_TABLET | Freq: Three times a day (TID) | ORAL | Status: DC
  Administered 2022-10-12 – 2022-10-14 (×4): 80 mg via ORAL
  Filled 2022-10-12 (×5): qty 1

## 2022-10-12 MED ORDER — OXYTOCIN-SODIUM CHLORIDE 30-0.9 UT/500ML-% IV SOLN
INTRAVENOUS | Status: DC | PRN
  Administered 2022-10-12: 200 mL via INTRAVENOUS

## 2022-10-12 MED ORDER — FENTANYL CITRATE (PF) 100 MCG/2ML IJ SOLN
INTRAMUSCULAR | Status: DC | PRN
  Administered 2022-10-12: 15 ug via INTRATHECAL

## 2022-10-12 MED ORDER — OXYTOCIN-SODIUM CHLORIDE 30-0.9 UT/500ML-% IV SOLN
INTRAVENOUS | Status: AC
  Filled 2022-10-12: qty 1000

## 2022-10-12 MED ORDER — OXYCODONE-ACETAMINOPHEN 5-325 MG PO TABS
1.0000 | ORAL_TABLET | ORAL | Status: DC | PRN
  Administered 2022-10-13 – 2022-10-14 (×3): 1 via ORAL
  Filled 2022-10-12 (×3): qty 1

## 2022-10-12 MED ORDER — PHENYLEPHRINE HCL (PRESSORS) 10 MG/ML IV SOLN
INTRAVENOUS | Status: DC | PRN
  Administered 2022-10-12: 80 ug via INTRAVENOUS

## 2022-10-12 MED ORDER — FENTANYL CITRATE (PF) 100 MCG/2ML IJ SOLN
INTRAMUSCULAR | Status: AC
  Filled 2022-10-12: qty 2

## 2022-10-12 MED ORDER — ZOLPIDEM TARTRATE 5 MG PO TABS: 5.0000 mg | ORAL_TABLET | Freq: Every evening | ORAL | Status: DC | PRN

## 2022-10-12 MED ORDER — ONDANSETRON HCL 4 MG/2ML IJ SOLN
INTRAMUSCULAR | Status: DC | PRN
  Administered 2022-10-12: 4 mg via INTRAVENOUS

## 2022-10-12 MED ORDER — SODIUM CHLORIDE 0.9 % IV SOLN
INTRAVENOUS | Status: AC
  Filled 2022-10-12: qty 2

## 2022-10-12 MED ORDER — SENNOSIDES-DOCUSATE SODIUM 8.6-50 MG PO TABS
2.0000 | ORAL_TABLET | ORAL | Status: DC
  Administered 2022-10-13: 2 via ORAL
  Filled 2022-10-12 (×2): qty 2

## 2022-10-12 MED ORDER — ACETAMINOPHEN 10 MG/ML IV SOLN
INTRAVENOUS | Status: AC
  Filled 2022-10-12: qty 100

## 2022-10-12 MED ORDER — LACTATED RINGERS IV SOLN: INTRAVENOUS | Status: DC

## 2022-10-12 MED ORDER — BUPIVACAINE IN DEXTROSE 0.75-8.25 % IT SOLN
INTRATHECAL | Status: DC | PRN
  Administered 2022-10-12: 1.6 mL via INTRATHECAL

## 2022-10-12 MED ORDER — PRENATAL MULTIVITAMIN CH
1.0000 | ORAL_TABLET | Freq: Every day | ORAL | Status: DC
  Administered 2022-10-13: 1 via ORAL
  Filled 2022-10-12 (×2): qty 1

## 2022-10-12 MED ORDER — SODIUM CHLORIDE 0.9% FLUSH: 3.0000 mL | INTRAVENOUS | Status: DC | PRN

## 2022-10-12 MED ORDER — MEASLES, MUMPS & RUBELLA VAC IJ SOLR: 0.5000 mL | Freq: Once | INTRAMUSCULAR | Status: DC

## 2022-10-12 MED ORDER — DEXAMETHASONE SODIUM PHOSPHATE 4 MG/ML IJ SOLN
INTRAMUSCULAR | Status: DC | PRN
  Administered 2022-10-12: 8 mg via INTRAVENOUS

## 2022-10-12 MED ORDER — SODIUM CHLORIDE 0.9 % IV SOLN
2.0000 g | INTRAVENOUS | Status: AC
  Administered 2022-10-12: 2 g via INTRAVENOUS

## 2022-10-12 MED ORDER — EPHEDRINE SULFATE-NACL 50-0.9 MG/10ML-% IV SOSY
PREFILLED_SYRINGE | INTRAVENOUS | Status: DC | PRN
  Administered 2022-10-12: 10 mg via INTRAVENOUS

## 2022-10-12 MED ORDER — ACETAMINOPHEN 10 MG/ML IV SOLN
INTRAVENOUS | Status: DC | PRN
  Administered 2022-10-12: 1000 mg via INTRAVENOUS

## 2022-10-12 MED ORDER — SIMETHICONE 80 MG PO CHEW: 80.0000 mg | CHEWABLE_TABLET | ORAL | Status: DC | PRN

## 2022-10-12 MED ORDER — PHENYLEPHRINE HCL-NACL 20-0.9 MG/250ML-% IV SOLN
INTRAVENOUS | Status: AC
  Filled 2022-10-12: qty 500

## 2022-10-12 MED ORDER — OXYTOCIN-SODIUM CHLORIDE 30-0.9 UT/500ML-% IV SOLN: 2.5000 [IU]/h | INTRAVENOUS | Status: AC

## 2022-10-12 MED ORDER — ONDANSETRON HCL 4 MG/2ML IJ SOLN
INTRAMUSCULAR | Status: AC
  Filled 2022-10-12: qty 2

## 2022-10-12 MED ORDER — MORPHINE SULFATE (PF) 0.5 MG/ML IJ SOLN
INTRAMUSCULAR | Status: AC
  Filled 2022-10-12: qty 10

## 2022-10-12 MED ORDER — DIPHENHYDRAMINE HCL 25 MG PO CAPS: 25.0000 mg | ORAL_CAPSULE | ORAL | Status: DC | PRN

## 2022-10-12 MED ORDER — COCONUT OIL OIL: 1.0000 | TOPICAL_OIL | Status: DC | PRN

## 2022-10-12 MED ORDER — OXYCODONE HCL 5 MG/5ML PO SOLN: 5.0000 mg | Freq: Once | ORAL | Status: DC | PRN

## 2022-10-12 MED ORDER — IBUPROFEN 600 MG PO TABS
600.0000 mg | ORAL_TABLET | Freq: Four times a day (QID) | ORAL | Status: DC | PRN
  Administered 2022-10-13 – 2022-10-14 (×4): 600 mg via ORAL
  Filled 2022-10-12 (×4): qty 1

## 2022-10-12 MED ORDER — DIPHENHYDRAMINE HCL 50 MG/ML IJ SOLN: 12.5000 mg | INTRAMUSCULAR | Status: DC | PRN

## 2022-10-12 MED ORDER — HYDROMORPHONE HCL 1 MG/ML IJ SOLN: 0.2500 mg | INTRAMUSCULAR | Status: DC | PRN

## 2022-10-12 MED ORDER — ONDANSETRON HCL 4 MG/2ML IJ SOLN: 4.0000 mg | Freq: Three times a day (TID) | INTRAMUSCULAR | Status: DC | PRN

## 2022-10-12 MED ORDER — MENTHOL 3 MG MT LOZG: 1.0000 | LOZENGE | OROMUCOSAL | Status: DC | PRN

## 2022-10-12 MED ORDER — EPHEDRINE 5 MG/ML INJ
INTRAVENOUS | Status: AC
  Filled 2022-10-12: qty 5

## 2022-10-12 MED ORDER — NALOXONE HCL 0.4 MG/ML IJ SOLN: 0.4000 mg | INTRAMUSCULAR | Status: DC | PRN

## 2022-10-12 MED ORDER — OXYTOCIN-SODIUM CHLORIDE 30-0.9 UT/500ML-% IV SOLN
INTRAVENOUS | Status: AC
  Filled 2022-10-12: qty 500

## 2022-10-12 MED ORDER — DIBUCAINE (PERIANAL) 1 % EX OINT: 1.0000 | TOPICAL_OINTMENT | CUTANEOUS | Status: DC | PRN

## 2022-10-12 MED ORDER — TETANUS-DIPHTH-ACELL PERTUSSIS 5-2.5-18.5 LF-MCG/0.5 IM SUSY: 0.5000 mL | PREFILLED_SYRINGE | Freq: Once | INTRAMUSCULAR | Status: DC

## 2022-10-12 MED ORDER — MORPHINE SULFATE (PF) 0.5 MG/ML IJ SOLN
INTRAMUSCULAR | Status: DC | PRN
  Administered 2022-10-12: 150 ug via INTRATHECAL

## 2022-10-12 MED ORDER — LABETALOL HCL 200 MG PO TABS
200.0000 mg | ORAL_TABLET | Freq: Two times a day (BID) | ORAL | Status: DC
  Administered 2022-10-12 – 2022-10-13 (×3): 200 mg via ORAL
  Filled 2022-10-12 (×3): qty 1

## 2022-10-12 MED ORDER — DIPHENHYDRAMINE HCL 25 MG PO CAPS: 25.0000 mg | ORAL_CAPSULE | Freq: Four times a day (QID) | ORAL | Status: DC | PRN

## 2022-10-12 SURGICAL SUPPLY — 32 items
BENZOIN TINCTURE PRP APPL 2/3 (GAUZE/BANDAGES/DRESSINGS) IMPLANT
CHLORAPREP W/TINT 26 (MISCELLANEOUS) ×2 IMPLANT
CLAMP UMBILICAL CORD (MISCELLANEOUS) ×1 IMPLANT
CLIP FILSHIE TUBAL LIGA STRL (Clip) IMPLANT
CLOTH BEACON ORANGE TIMEOUT ST (SAFETY) ×1 IMPLANT
DRSG OPSITE POSTOP 4X10 (GAUZE/BANDAGES/DRESSINGS) ×1 IMPLANT
ELECT REM PT RETURN 9FT ADLT (ELECTROSURGICAL) ×1
ELECTRODE REM PT RTRN 9FT ADLT (ELECTROSURGICAL) ×1 IMPLANT
EXTRACTOR VACUUM M CUP 4 TUBE (SUCTIONS) IMPLANT
GAUZE SPONGE 4X4 12PLY STRL LF (GAUZE/BANDAGES/DRESSINGS) IMPLANT
GLOVE BIOGEL PI IND STRL 7.0 (GLOVE) ×1 IMPLANT
GLOVE SURG ORTHO 8.0 STRL STRW (GLOVE) ×1 IMPLANT
GOWN STRL REUS W/TWL LRG LVL3 (GOWN DISPOSABLE) ×2 IMPLANT
HEMOSTAT ARISTA ABSORB 3G PWDR (HEMOSTASIS) IMPLANT
KIT ABG SYR 3ML LUER SLIP (SYRINGE) ×1 IMPLANT
NDL HYPO 25X5/8 SAFETYGLIDE (NEEDLE) ×1 IMPLANT
NEEDLE HYPO 25X5/8 SAFETYGLIDE (NEEDLE) ×3 IMPLANT
NS IRRIG 1000ML POUR BTL (IV SOLUTION) ×1 IMPLANT
PACK C SECTION WH (CUSTOM PROCEDURE TRAY) ×1 IMPLANT
PAD ABD 7.5X8 STRL (GAUZE/BANDAGES/DRESSINGS) IMPLANT
PAD OB MATERNITY 4.3X12.25 (PERSONAL CARE ITEMS) ×1 IMPLANT
STRIP CLOSURE SKIN 1/2X4 (GAUZE/BANDAGES/DRESSINGS) IMPLANT
SUT MNCRL 0 VIOLET CTX 36 (SUTURE) ×3 IMPLANT
SUT MON AB 4-0 PS1 27 (SUTURE) ×1 IMPLANT
SUT MONOCRYL 0 CTX 36 (SUTURE) ×5
SUT PDS AB 1 CT  36 (SUTURE)
SUT PDS AB 1 CT 36 (SUTURE) IMPLANT
SUT VIC AB 1 CTX 36 (SUTURE)
SUT VIC AB 1 CTX36XBRD ANBCTRL (SUTURE) IMPLANT
TOWEL OR 17X24 6PK STRL BLUE (TOWEL DISPOSABLE) ×1 IMPLANT
TRAY FOLEY W/BAG SLVR 14FR LF (SET/KITS/TRAYS/PACK) ×1 IMPLANT
WATER STERILE IRR 1000ML POUR (IV SOLUTION) ×1 IMPLANT

## 2022-10-12 NOTE — Anesthesia Procedure Notes (Signed)
Spinal  Patient location during procedure: OR Reason for block: surgical anesthesia Staffing Performed: anesthesiologist  Anesthesiologist: Aeisha Minarik E, MD Performed by: Geneviene Tesch E, MD Authorized by: Mayara Paulson E, MD   Preanesthetic Checklist Completed: patient identified, IV checked, risks and benefits discussed, surgical consent, monitors and equipment checked, pre-op evaluation and timeout performed Spinal Block Patient position: sitting Prep: DuraPrep and site prepped and draped Patient monitoring: continuous pulse ox, blood pressure and heart rate Approach: midline Location: L3-4 Injection technique: single-shot Needle Needle type: Pencan  Needle gauge: 24 G Needle length: 10 cm Assessment Events: CSF return Additional Notes Functioning IV was confirmed and monitors were applied. Sterile prep and drape, including hand hygiene and sterile gloves were used. The patient was positioned and the spine was prepped. The skin was anesthetized with lidocaine.  Free flow of clear CSF was obtained prior to injecting local anesthetic into the CSF. The needle was carefully withdrawn. The patient tolerated the procedure well.      

## 2022-10-12 NOTE — H&P (Addendum)
Stacy Myers is a 26 y.o. female presenting for primary c/s for twins (mono/di identical girls).  Followed by MFM for identical twins girls with IUGR and now with GHTN without severe features.  Stacy Myers desires primary c/s and presents for this now at 27 4/7 EGA, per MFM recommendations.  She had BMZ x 2 completed 48h ago.. OB History     Gravida  3   Para  2   Term  2   Preterm      AB  0   Living  2      SAB  0   IAB      Ectopic      Multiple  0   Live Births  2          Past Medical History:  Diagnosis Date   ADHD (attention deficit hyperactivity disorder)    Depression    Gestational hypertension    Past Surgical History:  Procedure Laterality Date   BREAST SURGERY     augmentation   NO PAST SURGERIES     Family History: family history includes Hypertension in her father, maternal grandfather, and paternal grandfather. Social History:  reports that she has never smoked. She has never used smokeless tobacco. She reports that she does not drink alcohol and does not use drugs.     Maternal Diabetes: No Genetic Screening: Normal Maternal Ultrasounds/Referrals: IUGR Fetal Ultrasounds or other Referrals:  Referred to Materal Fetal Medicine  Maternal Substance Abuse:  No Significant Maternal Medications:  None Significant Maternal Lab Results:  None Number of Prenatal Visits:greater than 3 verified prenatal visits Other Comments:  None  Review of Systems History   Blood pressure (!) 147/97, pulse 88, temperature 97.8 F (36.6 C), temperature source Oral, resp. rate 17, height 5\' 4"  (1.626 m), weight 85.7 kg, last menstrual period 01/09/2022, unknown if currently breastfeeding. Exam Physical Exam  Vitals and nursing note reviewed. Exam conducted with a chaperone present.  Constitutional:      Appearance: Normal appearance.  HENT:     Head: Normocephalic.  Eyes:     Pupils: Pupils are equal, round, and reactive to light.  Cardiovascular:      Rate and Rhythm: Normal rate and regular rhythm.     Pulses: Normal pulses.  Abdominal:     General: Abdomen is Gravid, nontender Neurological:     Mental Status: She is alert.  Cx 3/70/-2 Vtx/Ctx Prenatal labs: ABO, Rh: --/--/A POS (12/08 1014) Antibody: NEG (12/08 1014) Rubella: Immune (06/13 0000) RPR: NON REACTIVE (12/08 1009)  HBsAg: Negative (06/13 0000)  HIV: Non-reactive (06/13 0000)  GBS:     Assessment/Plan: IUP at 34 4/7 Monochorionic/Diamniotic identical twins girls IUGR of both, without discordancy GHTN, without severe features S/P BMZ series completed 48h ago MFM recommended delivery Rital desires primary C/S Risks and benefits of C/S were discussed.  All questions were answered and informed consent was obtained.  Plan to proceed with low segment transverse Cesarean Section.  This patient has been seen and examined.   All of her questions were answered.  Labs and vital signs reviewed.  Informed consent has been obtained.  The History and Physical is current.  [name] 10/12/2022, 9:31 AM

## 2022-10-12 NOTE — Anesthesia Postprocedure Evaluation (Signed)
Anesthesia Post Note  Patient: Stacy Myers [facility]  Procedure(s) Performed: PRIMARY CESAREAN SECTION EDC: 11-19-22 ALLERG: NKDA     Patient location during evaluation: PACU Anesthesia Type: Spinal Level of consciousness: oriented and awake and alert Pain management: pain level controlled Vital Signs Assessment: post-procedure vital signs reviewed and stable Respiratory status: spontaneous breathing, respiratory function stable and nonlabored ventilation Cardiovascular status: blood pressure returned to baseline and stable Postop Assessment: no headache, no backache, no apparent nausea or vomiting and spinal receding Anesthetic complications: no   No notable events documented.  Last Vitals:  Vitals:   10/12/22 1145 10/12/22 1200  BP: (!) 145/94 (!) 149/87  Pulse:  67  Resp: 18 (!) 22  Temp:  37.2 C  SpO2: 100% 99%    Last Pain:  Vitals:   10/12/22 1200  TempSrc: Oral  PainSc:    Pain Goal: Patients Stated Pain Goal: 5 (10/12/22 1145)  LLE Motor Response: Non-purposeful movement (10/12/22 1200) LLE Sensation: Tingling (10/12/22 1200) RLE Motor Response: Non-purposeful movement (10/12/22 1200) RLE Sensation: Tingling (10/12/22 1200) L Sensory Level: L3-Anterior knee, lower leg (10/12/22 1200) R Sensory Level: L3-Anterior knee, lower leg (10/12/22 1200) Epidural/Spinal Function Cutaneous sensation: Able to Wiggle Toes (10/12/22 1200), Patient able to flex knees: Yes (10/12/22 1200), Patient able to lift hips off bed: No (10/12/22 1200), Back pain beyond tenderness at insertion site: No (10/12/22 1200), Progressively worsening motor and/or sensory loss: No (10/12/22 1200)  Lucretia Kern

## 2022-10-12 NOTE — Anesthesia Preprocedure Evaluation (Signed)
Anesthesia Evaluation  Patient identified by MRN, date of birth, ID band Patient awake    Reviewed: Allergy & Precautions, H&P , NPO status , Patient's Chart, lab work & pertinent test results  History of Anesthesia Complications Negative for: history of anesthetic complications  Airway Mallampati: II  TM Distance: >3 FB     Dental   Pulmonary neg pulmonary ROS   Pulmonary exam normal        Cardiovascular hypertension,  Rhythm:regular Rate:Normal     Neuro/Psych negative neurological ROS  negative psych ROS   GI/Hepatic negative GI ROS, Neg liver ROS,,,  Endo/Other  negative endocrine ROS    Renal/GU negative Renal ROS  negative genitourinary   Musculoskeletal   Abdominal   Peds  Hematology negative hematology ROS (+)   Anesthesia Other Findings Primary C/S for mono/di twins  Reproductive/Obstetrics (+) Pregnancy G3P2002 at [redacted]w[redacted]d                             Anesthesia Physical Anesthesia Plan  ASA: 2  Anesthesia Plan: Spinal   Post-op Pain Management:    Induction:   PONV Risk Score and Plan: Ondansetron and Treatment may vary due to age or medical condition  Airway Management Planned:   Additional Equipment:   Intra-op Plan:   Post-operative Plan:   Informed Consent: I have reviewed the patients History and Physical, chart, labs and discussed the procedure including the risks, benefits and alternatives for the proposed anesthesia with the patient or authorized representative who has indicated his/her understanding and acceptance.       Plan Discussed with: Anesthesiologist  Anesthesia Plan Comments:        Anesthesia Quick Evaluation

## 2022-10-12 NOTE — Transfer of Care (Signed)
Immediate Anesthesia Transfer of Care Note  Patient: Stacy Myers  Procedure(s) Performed: PRIMARY CESAREAN SECTION EDC: 11-19-22 ALLERG: NKDA  Patient Location: PACU  Anesthesia Type:Spinal  Level of Consciousness: awake, alert , and oriented  Airway & Oxygen Therapy: Patient Spontanous Breathing  Post-op Assessment: Report given to RN and Post -op Vital signs reviewed and stable  HR 70, RR 18, BP 147/90, SaO2 99% Post vital signs: Reviewed and stable  Last Vitals:  Vitals Value Taken Time  BP    Temp    Pulse    Resp    SpO2      Last Pain:  Vitals:   10/12/22 0829  TempSrc: Oral         Complications: No notable events documented.

## 2022-10-12 NOTE — Op Note (Signed)
Cesarean Section Procedure Note  Pre-operative Diagnosis: Twins at 3 4/7, Monozygotic Monochorionic/Diamnionic, IUGR, Gestational HTN  Post-operative Diagnosis: same  Surgeon: Turner Daniels   Assistants: Dr. Miquel Dunn, MD  An experienced assistant was required given the standard of surgical care given the complexity of the case.  This assistant was needed for exposure, dissection, suctioning, retraction, instrument exchange, assisting with delivery with administration of fundal pressure, and for overall help during the procedure Anesthesia: spinal  Procedure:  Low Segment Transverse cesarean section  Procedure Details  The patient was seen in the Holding Room. The risks, benefits, complications, treatment options, and expected outcomes were discussed with the patient.  The patient concurred with the proposed plan, giving informed consent.  The site of surgery properly noted/marked.. A Time Out was held and the above information confirmed.  After induction of anesthesia, the patient was draped and prepped in the usual sterile manner. A Pfannenstiel incision was made and carried down through the subcutaneous tissue to the fascia. Fascial incision was made and extended transversely. The fascia was separated from the underlying rectus tissue superiorly and inferiorly. The peritoneum was identified and entered. Peritoneal incision was extended longitudinally. The utero-vesical peritoneal reflection was incised transversely and the bladder flap was bluntly freed from the lower uterine segment. A low transverse uterine incision was made. Delivered from vertex presentation was a baby A, with Apgar scores of both twins assigned by NICU and taken to the NICU.   After the umbilical cord was clamped and cut cord blood was obtained for evaluation and marked with umbilical clamp.  AROM for B then vertex female delivered,  the umbilical cord was clamped and cut cord blood was obtained for evaluation. The placenta was  removed intact and appeared normal. The uterine outline, tubes and ovaries appeared normal. The uterine incision was closed with running locked sutures of 0 monocryl and imbricated with 0 monocryl. Hemostasis was observed. Lavage was carried out until clear. The peritoneum was then closed with 0 monocryl and rectus muscles plicated in the midline.  After hemostasis was assured, the fascia was then reapproximated with running sutures of 0 Vicryl. Irrigation was applied and after adequate hemostasis was assured, the skin was reapproximated with subcutaneous sutures using 4-0 monocryl.  Instrument, sponge, and needle counts were correct prior the abdominal closure and at the conclusion of the case. The patient received 2 grams cefotetan preoperatively.  Findings: Viable females A & B, both vertex.  Single posterior placenta sent to path with clamp on cord of A  Estimated Blood Loss:  758cc         Specimens: Placenta was sent to path         Complications:  None

## 2022-10-13 ENCOUNTER — Encounter (HOSPITAL_COMMUNITY): Payer: Self-pay | Admitting: Obstetrics and Gynecology

## 2022-10-13 LAB — CBC
HCT: 27.9 % — ABNORMAL LOW (ref 36.0–46.0)
Hemoglobin: 9.7 g/dL — ABNORMAL LOW (ref 12.0–15.0)
MCH: 31.1 pg (ref 26.0–34.0)
MCHC: 34.8 g/dL (ref 30.0–36.0)
MCV: 89.4 fL (ref 80.0–100.0)
Platelets: 173 10*3/uL (ref 150–400)
RBC: 3.12 MIL/uL — ABNORMAL LOW (ref 3.87–5.11)
RDW: 13.5 % (ref 11.5–15.5)
WBC: 11.5 10*3/uL — ABNORMAL HIGH (ref 4.0–10.5)
nRBC: 0 % (ref 0.0–0.2)

## 2022-10-13 MED ORDER — LABETALOL HCL 200 MG PO TABS
300.0000 mg | ORAL_TABLET | Freq: Two times a day (BID) | ORAL | Status: DC
  Administered 2022-10-13 – 2022-10-14 (×2): 300 mg via ORAL
  Filled 2022-10-13 (×2): qty 1

## 2022-10-13 NOTE — Progress Notes (Signed)
MOB was referred for history of depression/anxiety. * Referral screened out by Clinical Social Worker because none of the following criteria appear to apply: ~ History of anxiety/depression during this pregnancy, or of post-partum depression. ~ Diagnosis of anxiety and/or depression within last 3 years OR * MOB's symptoms currently being treated with medication and/or therapy.   Patient screened out for psychosocial assessment since none of the following apply: Psychosocial stressors documented in mother or baby's chart Gestation less than 32 weeks Code at delivery  Infant with anomalies  Please contact the Clinical Social Worker if specific needs arise or by MOB's request. MOB's Edinburgh score is 0.  Blaine Hamper, MSW, LCSW Clinical Social Work [phone]

## 2022-10-13 NOTE — Progress Notes (Signed)
Subjective: Postpartum Day 1: Cesarean Delivery s/s gHTN and mono-di twins Patient reports tolerating PO and no problems voiding.    Objective: Vital signs in last 24 hours: Temp:  [98.1 F (36.7 C)-99 F (37.2 C)] 98.2 F (36.8 C) (12/12 0309) Pulse Rate:  [61-81] 75 (12/12 0821) Resp:  [18-22] 18 (12/12 0309) BP: (127-150)/(87-98) 127/90 (12/12 0821) SpO2:  [97 %-100 %] 99 % (12/12 0309)  Physical Exam:  General: alert Lochia: appropriate Uterine Fundus: firm Incision: healing well DVT Evaluation: No evidence of DVT seen on physical exam.  Recent Labs    10/13/22 0720  HGB 9.7*  HCT 27.9*    Assessment/Plan: Status post Cesarean section. Doing well postoperatively.  Mild range BP elevation overnight. No s/s severe disease.  Will increase Labetalol to 300mg  BID.  Baby girls on RA in NICU and doing well.   , MD 10/13/2022, 9:01 AM

## 2022-10-13 NOTE — Lactation Note (Signed)
This note was copied from a baby's chart.  NICU Lactation Consultation Note  Patient Name: Stacy Myers [id number] Date: 10/13/2022 Age:25 hours  Subjective Reason for consult: Initial assessment; Late-preterm 34-36.6wks; Infant < 6lbs; NICU baby; Multiple gestation; Breast augmentation; Exclusive pumping and bottle feeding  Visited with family of 66 hours old LPI NICU female, Stacy Myers is a P3 and experienced breastfeeding. Her plan is to exclusively pump and bottle feed for these babies; she voiced she struggled achieving her breastfeeding goals with her last baby and that put too much stress on her. She came as formula, but decided to pump and bottle feed in addition to using Similac 24 calorie formula. She has been pumping a few times and already getting small volumes of colostrum, praised her for her efforts. Reviewed pumping schedule, lactogenesis II and anticipatory guidelines.  Objective Infant data: Mother's Current Feeding Choice: Breast Milk and Formula  Infant feeding assessment Scale for Readiness: 2  Maternal data: [id number]  C-Section, Low Transverse Significant Breast History:: moderata breast changes during the pregnancy, breast augmentation in 2017 (after her first baby) Current breast feeding challenges:: NICU admission Previous breastfeeding challenges?: Other (Comment) (commitmemt) Does the patient have breastfeeding experience prior to this delivery?: Yes How long did the patient breastfeed?: 12 months Pumping frequency: q 3 hours (recommended) Pumped volume: 10 mL Flange Size: 24 Risk factor for low milk supply:: prematurity, infant separation, maternal blood loss of 1055 cc. Pump: Personal Chiropractor and Elvie)  Assessment Infant: Feeding Status: -- (Scheduled feedings)  Maternal: Milk volume: Normal  Intervention/Plan Interventions: Breast feeding basics reviewed; DEBP; Education; [employer] brochure Tools: Pump; Flanges; Coconut oil Pump  Education: Setup, frequency, and cleaning; Milk Storage  Plan of care: Encouraged pumping every 3 hours, but she'll go at her own pace Breast massage, hand expression and coconut oil were also encouraged prior pumping  FOB present and supportive. All questions and concerns answered, family to contact [facility] services PRN.  Consult Status: NICU follow-up  NICU Follow-up type: New admission follow up; Maternal D/C visit; Verify onset of copious milk; Verify absence of engorgement   Castulo Scarpelli S Javion Holmer 10/13/2022, 6:09 PM

## 2022-10-14 MED ORDER — LABETALOL HCL 300 MG PO TABS: 300.0000 mg | ORAL_TABLET | Freq: Two times a day (BID) | ORAL | 0 refills | Status: AC

## 2022-10-14 MED ORDER — OXYCODONE-ACETAMINOPHEN 5-325 MG PO TABS: 1.0000 | ORAL_TABLET | ORAL | 0 refills | Status: AC | PRN

## 2022-10-14 MED ORDER — IBUPROFEN 600 MG PO TABS: 600.0000 mg | ORAL_TABLET | Freq: Four times a day (QID) | ORAL | 0 refills | Status: AC | PRN

## 2022-10-14 NOTE — Discharge Summary (Signed)
Postpartum Discharge Summary    Patient Name: Stacy Myers DOB: 02/21/1996 MRN: [id number]  Date of admission: 10/12/2022 Delivery date:   Makayla, Lanter [[id number]]  10/12/2022    Nicholle, Falzon [[id number]]  10/12/2022  Delivering provider:    Daryll Brod [[id number]]  [place], DAVID    Taron, Conrey [[id number]]  [place], DAVID  Date of discharge: 10/14/2022  Admitting diagnosis: Twin pregnancy [O30.009] Cesarean delivery delivered [O82] Monozygotic twins [O30.009] Intrauterine pregnancy: [redacted]w[redacted]d    Secondary diagnosis:  Principal Problem:   Twin pregnancy Active Problems:   Cesarean delivery delivered   Monozygotic twins  Additional problems: Gestational hypertension    Discharge diagnosis: Preterm Pregnancy Delivered and Gestational Hypertension                                              Post partum procedures: none Augmentation: N/A Complications: None  Hospital course: Sceduled C/S   26y.o. yo [id number] admitted to the hospital 10/12/2022 for scheduled cesarean section with the following indication:Multifetal Gestation.  Patient had mono/di twins. Followed by MFM for identical twins girls with IUGR and now with GHTN without severe features. CaDelandaesires primary c/s and presented at 3430 4/7GA, per MFM recommendations. Delivery details are as follows:  Membrane Rupture Time/Date:    GaDesta, Bujak0[[id number]]10:41 AM    GaDeborah Chalk0[[id number]]10:44 AM ,   GaMikahla, Wisor0[[id number]]10/12/2022    GaKijuana, Ruppel0[[id number]]10/12/2022   Delivery Method:   GaDaryll Brod0[[id number]]C-Section, Low Transverse    GaRosaisela, Jamroz0[[id number]]C-Section, Low Transverse  Details of operation can be found in separate operative note.  Patient had a postpartum course complicated by n/a.  She is ambulating, tolerating a regular  diet, passing flatus, and urinating well. Patient is discharged home in stable condition on  10/14/22        Newborn Data: Birth date:   GaEden, Toohey0[[id number]]10/12/2022    GaArlone, Lenhardt0[[id number]]10/12/2022  Birth time:   GaMalley, Hauter0[[id number]]10:42 AM    GaDeborah Chalk0[[id number]]10:44 AM  Gender:   GaAayat, Hajjar0[[id number]]Female    GaNova, SchmuhlaGilby0[[id number]]Female  Living status:   GaJerine, Surles0[[id number]]Living    GaAlliana, Mcauliffa[place]0[[id number]]Living  Apgars:   GaGwenith, Tschida0[[id number]]9 [address][[id number]]  1 ,   SELTRVUY, EBXID HWYSHUOH0[[id number]]9 [address]0[[id number]]9  Weight:   GaShalita, Notte0[[id number]]  3005    GaOndria, Oswald0[[id number]]2190 g     Magnesium Sulfate received: No BMZ received: Yes Rhophylac:No MMR:Yes T-DaP:Given prenatally Flu: Yes Transfusion:No  Physical exam  Vitals:   10/13/22 0821 10/13/22 1251 10/13/22 2208 10/14/22 0504  BP: (!) 127/90 131/86 (!) 142/94 128/86  Pulse: 75 84 84 74  Resp:  _0 Temp:  98.4 F (36.9 C) (!) 97.5 F (36.4 C) 98.9 F (37.2 C)  TempSrc:   Oral Oral  SpO2:  99% 97% 98%  Weight:      Height:       General:  alert, cooperative, and no distress Lochia: appropriate Uterine Fundus: firm Incision: Healing well with no significant drainage, No significant erythema DVT Evaluation: No evidence of DVT seen on physical exam. Negative Homan's sign. No cords or calf tenderness. Labs: Lab Results  Component Value Date   WBC 11.5 (H) 10/13/2022   HGB 9.7 (L) 10/13/2022   HCT 27.9 (L) 10/13/2022   MCV 89.4 10/13/2022   PLT 173 10/13/2022      Latest Ref Rng & Units 10/12/2022    8:41 AM  CMP  Glucose 70 - 99 mg/dL 80   BUN 6 - 20 mg/dL 9   Creatinine 0.44 - 1.00 mg/dL 0.57   Sodium 135 -  145 mmol/L 136   Potassium 3.5 - 5.1 mmol/L 4.0   Chloride 98 - 111 mmol/L 106   CO2 22 - 32 mmol/L 22   Calcium 8.9 - 10.3 mg/dL 9.0   Total Protein 6.5 - 8.1 g/dL 5.4   Total Bilirubin 0.3 - 1.2 mg/dL 0.3   Alkaline Phos 38 - 126 U/L 137   AST 15 - 41 U/L 24   ALT 0 - 44 U/L 15    Edinburgh Score:    10/12/2022    3:00 PM  Edinburgh Postnatal Depression Scale Screening Tool  I have been able to laugh and see the funny side of things. 0  I have looked forward with enjoyment to things. 0  I have blamed myself unnecessarily when things went wrong. 0  I have been anxious or worried for no good reason. 0  I have felt scared or panicky for no good reason. 0  Things have been getting on top of me. 0  I have been so unhappy that I have had difficulty sleeping. 0  I have felt sad or miserable. 0  I have been so unhappy that I have been crying. 0  The thought of harming myself has occurred to me. 0  Edinburgh Postnatal Depression Scale Total 0     After visit meds:  Allergies as of 10/14/2022   No Known Allergies      Medication List     STOP taking these medications    aspirin EC 81 MG tablet   docusate sodium 100 MG capsule Commonly known as: COLACE   ferrous sulfate 325 (65 FE) MG tablet       TAKE these medications    ibuprofen 600 MG tablet Commonly known as: ADVIL Take 1 tablet (600 mg total) by mouth every 6 (six) hours as needed for cramping, moderate pain or mild pain.   labetalol 300 MG tablet Commonly known as: NORMODYNE Take 1 tablet (300 mg total) by mouth 2 (two) times daily.   multivitamin-prenatal 27-0.8 MG Tabs tablet Take 1 tablet by mouth daily at 12 noon.   oxyCODONE-acetaminophen 5-325 MG tablet Commonly known as: PERCOCET/ROXICET Take 1 tablet by mouth every 4 (four) hours as needed for up to 7 days for moderate pain.         Discharge home in stable condition Infant Feeding: Breast Infant Disposition:NICU Discharge  instruction: per After Visit Summary and Postpartum booklet. Activity: Advance as tolerated. Pelvic rest for 6 weeks.  Diet: routine diet Future Appointments:No future appointments. Follow up Visit: 1 week BP/incision check   10/14/2022 Linda Hedges, DO

## 2022-10-14 NOTE — Discharge Instructions (Signed)
Call MD for T>100.4, heavy vaginal bleeding, severe abdominal pain, intractable nausea and/or vomiting, or respiratory distress.  Call office to schedule BP check in 1 week.  Pelvic rest x 6 weeks.  No driving while taking narcotics.

## 2022-10-14 NOTE — Progress Notes (Signed)
Subjective: Postpartum Day 2: Cesarean Delivery Patient reports tolerating PO, + flatus, and no problems voiding.  No HA, vision change, RUQ pain, CP/SOB.  Objective: Vital signs in last 24 hours: Temp:  [97.5 F (36.4 C)-98.9 F (37.2 C)] 98.9 F (37.2 C) (12/13 0504) Pulse Rate:  [74-84] 74 (12/13 0504) Resp:  [17-18] 18 (12/13 0504) BP: (127-142)/(86-94) 128/86 (12/13 0504) SpO2:  [97 %-99 %] 98 % (12/13 0504)  Physical Exam:  General: alert, cooperative, and appears stated age Lochia: appropriate Uterine Fundus: firm Incision: healing well, no significant drainage DVT Evaluation: No evidence of DVT seen on physical exam. Negative Homan's sign. No cords or calf tenderness.  Recent Labs    10/13/22 0720  HGB 9.7*  HCT 27.9*    Assessment/Plan: Status post Cesarean section. Doing well postoperatively.  Discharge home with standard precautions and return to clinic in 4-6 weeks. GHTN-No s/sx.  BPs normal to low mild range with labetalol 300 BID.  F/U in office for BP check in 1 week.  Mitchel Honour, DO 10/14/2022, 8:12 AM

## 2022-10-14 NOTE — Lactation Note (Signed)
This note was copied from a baby's chart.  NICU Lactation Consultation Note  Patient Name: Stacy Myers [id number] Date: 10/14/2022 Age:25 hours  Subjective Reason for consult: Follow-up assessment; NICU baby; Multiple gestation; Infant < 6lbs; Breast augmentation; Late-preterm 34-36.6wks; Maternal discharge  Visited with family of 30 hours old LPI NICU female; Ms. Storr is a P3 and experienced breastfeeding. She reported she hasn't pumped today since her plan is pumping at her own pace. Explained the importance of avoiding long intervals between pumping sessions for the onset of lactogenesis II and the prevention of engorgement. She's getting discharged today. Reviewed discharge education, lactogenesis II/III, pump settings and anticipatory guidelines.  Objective Infant data: Mother's Current Feeding Choice: Breast Milk and Formula  Infant feeding assessment Scale for Readiness: 3  Maternal data: [id number]  C-Section, Low Transverse Significant Breast History:: moderate breast changes during the pregnancy; breast augmentation in 2017 (after her first baby) Current breast feeding challenges:: NICU admission Previous breastfeeding challenges?: Other (Comment) (commitment) Does the patient have breastfeeding experience prior to this delivery?: Yes How long did the patient breastfeed?: 10 months Pumping frequency: Didn't pump today, the last time she pumped was yesterday and got 10 ml of EBM Pumped volume: 0 mL Flange Size: 24 Risk factor for low milk supply:: prematurity, infant separation, maternal blood loss of 1055 cc. Pump: Personal Chiropractor and Elvie)  Assessment Infant: Feeding Status: -- (scheduled feedings)  Maternal: Milk volume: Normal  Intervention/Plan Interventions: Breast feeding basics reviewed; DEBP; Education Tools: Pump; Flanges; Coconut oil Pump Education: Setup, frequency, and cleaning; Milk Storage  Plan of care: Encouraged pumping every 3  hours, or at her own pace avoiding going more than 6 hours without pumping She'll take all pump parts to baby's room after her discharge She'll switch her pump settings from initiation to expression mode once she gets 20 ml of EBM combined Parents will continue working on bottle feedings   Female visitor present and supportive. All questions and concerns answered, family to contact [facility] services PRN.  Consult Status: NICU follow-up  NICU Follow-up type: Verify onset of copious milk; Verify absence of engorgement; Weekly NICU follow up   [employer] 10/14/2022, 11:05 AM

## 2022-10-15 ENCOUNTER — Ambulatory Visit: Payer: BC Managed Care – PPO

## 2022-10-16 LAB — SURGICAL PATHOLOGY

## 2022-10-20 ENCOUNTER — Telehealth (HOSPITAL_COMMUNITY): Payer: Self-pay | Admitting: *Deleted

## 2022-10-20 NOTE — Telephone Encounter (Signed)
Attempted Hospital Discharge Follow-Up Call.  Left voice mail requesting that patient return RN's phone call if patient has any concerns or questions regarding her healing process. 

## 2022-10-22 ENCOUNTER — Encounter (HOSPITAL_COMMUNITY): Payer: Self-pay

## 2022-10-22 ENCOUNTER — Inpatient Hospital Stay (HOSPITAL_COMMUNITY): Admit: 2022-10-22 | Payer: BC Managed Care – PPO | Admitting: Obstetrics and Gynecology

## 2022-10-22 SURGERY — Surgical Case
Anesthesia: Regional

## 2022-10-23 ENCOUNTER — Encounter (HOSPITAL_COMMUNITY): Payer: Self-pay

## 2022-10-23 ENCOUNTER — Inpatient Hospital Stay (HOSPITAL_COMMUNITY): Payer: BC Managed Care – PPO

## 2022-10-24 ENCOUNTER — Ambulatory Visit: Payer: Self-pay

## 2022-10-24 NOTE — Lactation Note (Signed)
This note was copied from a baby's chart.  NICU Lactation Consultation Note  Patient Name: Stacy Myers EUMPN'[id number] Date: 10/24/2022 Age:25 days   Subjective Reason for consult: Follow-up assessment; NICU baby; Late-preterm 34-36.6wks; Infant < 6lbs; Multiple gestation; Exclusive pumping and bottle feeding  Lactation followed up with Stacy Myers in NICU. Her plan is to pump exclusively and bottle feed her twins. She is pumping appropriate volumes for twins; she states that she pump with a previous child and had strong volumes as well. She denies any concerns at this time and does not have questions about her plan or pumping.  Lactation to monitor and follow up PRN.  Objective Infant data: Mother's Current Feeding Choice: Breast Milk  Infant feeding assessment Scale for Readiness: 3 Scale for Quality: 4  Maternal data: [id number]  C-Section, Low Transverse Current breast feeding challenges:: NICU  Does the patient have breastfeeding experience prior to this delivery?: Yes How long did the patient breastfeed?: pumped 1 year  Pumping frequency: q3 hours Pumped volume: 180 mL (180-270/session (6-9 ounces)  Pump: Personal  Assessment  Maternal: Milk volume: Abundant  Intervention/Plan Interventions: Breast feeding basics reviewed; Education  Pump Education: Setup, frequency, and cleaning  Plan: Consult Status: NICU follow-up  NICU Follow-up type: Weekly NICU follow up    Walker Shadow 10/24/2022, 9:07 AM
